# Patient Record
Sex: Male | Born: 1990 | Race: White | Hispanic: No | Marital: Single | Smoking: Current every day smoker
Health system: Southern US, Community
[De-identification: ages and names within clinical notes are randomized; demographics above are authoritative.]

## PROBLEM LIST (undated history)

## (undated) DIAGNOSIS — F329 Major depressive disorder, single episode, unspecified: Secondary | ICD-10-CM

## (undated) DIAGNOSIS — F410 Panic disorder [episodic paroxysmal anxiety] without agoraphobia: Secondary | ICD-10-CM

---
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
## 2009-03-05 ENCOUNTER — Emergency Department (HOSPITAL_COMMUNITY): Admission: EM | Admit: 2009-03-05 | Discharge: 2009-03-05 | Payer: Self-pay | Admitting: Emergency Medicine
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
## 2012-02-14 ENCOUNTER — Emergency Department: Payer: Self-pay | Admitting: Emergency Medicine

## 2012-02-14 LAB — URINALYSIS, COMPLETE
Bacteria: NONE SEEN
Bilirubin,UR: NEGATIVE
Glucose,UR: NEGATIVE mg/dL (ref 0–75)
Ketone: NEGATIVE
Leukocyte Esterase: NEGATIVE
Ph: 6 (ref 4.5–8.0)
Protein: NEGATIVE
RBC,UR: 1 /HPF (ref 0–5)
Specific Gravity: 1.002 (ref 1.003–1.030)
Squamous Epithelial: NONE SEEN
WBC UR: 1 /HPF (ref 0–5)

## 2012-08-02 ENCOUNTER — Emergency Department: Payer: Self-pay | Admitting: Emergency Medicine

## 2012-08-02 LAB — COMPREHENSIVE METABOLIC PANEL
Albumin: 4.3 g/dL (ref 3.4–5.0)
BUN: 12 mg/dL (ref 7–18)
Bilirubin,Total: 0.8 mg/dL (ref 0.2–1.0)
Calcium, Total: 9.1 mg/dL (ref 8.5–10.1)
Creatinine: 1.33 mg/dL — ABNORMAL HIGH (ref 0.60–1.30)
EGFR (Non-African Amer.): >60
Potassium: 4.2 mmol/L (ref 3.5–5.1)
SGPT (ALT): 17 U/L (ref 12–78)

## 2012-08-02 LAB — TSH: Thyroid Stimulating Horm: 0.65 u[IU]/mL

## 2012-08-02 LAB — URINALYSIS, COMPLETE
Leukocyte Esterase: NEGATIVE
Nitrite: NEGATIVE
Ph: 5 (ref 4.5–8.0)
Protein: NEGATIVE
Specific Gravity: 1.028 (ref 1.003–1.030)
WBC UR: 2 /HPF (ref 0–5)

## 2012-08-02 LAB — CBC
MCH: 32.6 pg (ref 26.0–34.0)
Platelet: 179 10*3/uL (ref 150–440)
RDW: 13.3 % (ref 11.5–14.5)
WBC: 6.5 10*3/uL (ref 3.8–10.6)

## 2012-08-02 LAB — DRUG SCREEN, URINE
Amphetamines, Ur Screen: NEGATIVE (ref ?–1000)
Barbiturates, Ur Screen: NEGATIVE (ref ?–200)
Benzodiazepine, Ur Scrn: POSITIVE (ref ?–200)
Cannabinoid 50 Ng, Ur ~~LOC~~: POSITIVE (ref ?–50)
Cocaine Metabolite,Ur ~~LOC~~: POSITIVE (ref ?–300)
Phencyclidine (PCP) Ur S: NEGATIVE (ref ?–25)
Tricyclic, Ur Screen: NEGATIVE (ref ?–1000)

## 2012-08-02 LAB — ETHANOL: Ethanol %: <0.003 % (ref 0.000–0.080)

## 2013-01-24 ENCOUNTER — Emergency Department: Payer: Self-pay | Admitting: Emergency Medicine

## 2014-08-27 ENCOUNTER — Encounter (HOSPITAL_COMMUNITY): Payer: Self-pay | Admitting: *Deleted

## 2014-08-27 ENCOUNTER — Emergency Department (HOSPITAL_COMMUNITY)
Admission: EM | Admit: 2014-08-27 | Discharge: 2014-08-28 | Disposition: A | Discharge status: Home / Self Care | Payer: Self-pay | Attending: Emergency Medicine | Admitting: Emergency Medicine

## 2014-08-27 DIAGNOSIS — R079 Chest pain, unspecified: Secondary | ICD-10-CM | POA: Insufficient documentation

## 2014-08-27 DIAGNOSIS — R002 Palpitations: Secondary | ICD-10-CM | POA: Insufficient documentation

## 2014-08-27 DIAGNOSIS — F419 Anxiety disorder, unspecified: Secondary | ICD-10-CM | POA: Insufficient documentation

## 2014-08-27 DIAGNOSIS — Z720 Tobacco use: Secondary | ICD-10-CM | POA: Insufficient documentation

## 2014-08-27 DIAGNOSIS — R0602 Shortness of breath: Secondary | ICD-10-CM | POA: Insufficient documentation

## 2014-08-27 HISTORY — DX: Panic disorder (episodic paroxysmal anxiety): F41.0

## 2014-08-27 LAB — I-STAT TROPONIN, ED: Troponin i, poc: 0 ng/mL (ref 0.00–0.08)

## 2014-08-27 NOTE — ED Provider Notes (Signed)
CSN: [id number]     Arrival date & time 08/27/14  2145 History   This chart was scribed for Devoria AlbeIva Jamilynn Whitacre, MD by Evon Slackerrance Branch, ED Scribe. This patient was seen in room APA10/APA10 and the patient's care was started at 11:03 PM.      Chief Complaint  Patient presents with  . Panic Attack   The history is provided by the patient. No language interpreter was used.   HPI Comments: David Lowe is a 24 y.o. male who presents to the Emergency Department complaining of severe anxiety that began this morning when waking up. Pt doesn't report any recent stressors that has recently made his anxiety worse, pt states that he suffers from anxiety constantly and daily. Pt states that he was treated by therapist at the age of 24. Pt states that he was taking medications for the anxiety for about 2-3 years after seeing the therapist. He states he was on Klonopin, Seroquel, and Depakote. Pt states that his symptoms include "heart pain", feeling his heart is beating hard and fast, chest tightness, SOB, "tightness and anxiety around my jaw and face" and tingling in his arms and hands. Pt states that the his anxiety attacks usually last all day until he goes to sleep. Patient basically states he feels this way every day. He also describes insomnia. Pt is an everyday smoker. Pt denies alcohol use. Mother states she has has a Hx of anxiety as does his paternal grandmother. There is a family history of heart disease. Pt denies illicit drug use.   PCP none   Past Medical History  Diagnosis Date  . Anxiety attack    History reviewed. No pertinent past surgical history. History reviewed. No pertinent family history. History  Substance Use Topics  . Smoking status: Current Every Day Smoker -- 0.50 packs/day  . Smokeless tobacco: Not on file  . Alcohol Use: No  unemployed  Review of Systems  Respiratory: Positive for chest tightness and shortness of breath.   Cardiovascular: Positive for chest pain and palpitations.   Psychiatric/Behavioral: The patient is nervous/anxious.   All other systems reviewed and are negative.    Allergies  Zyrtec  Home Medications   Prior to Admission medications   Not on File   BP 141/92 mmHg  Pulse 69  Temp(Src) 98.1 F (36.7 C) (Oral)  Resp 18  Ht 5\' 9"  (1.753 m)  Wt 150 lb (68.04 kg)  BMI 22.14 kg/m2  SpO2 99%  Vital signs normal     Physical Exam  Constitutional: He is oriented to person, place, and time. He appears well-developed and well-nourished.  Non-toxic appearance. He does not appear ill. No distress.  HENT:  Head: Normocephalic and atraumatic.  Right Ear: External ear normal.  Left Ear: External ear normal.  Nose: Nose normal. No mucosal edema or rhinorrhea.  Mouth/Throat: Oropharynx is clear and moist and mucous membranes are normal. No dental abscesses or uvula swelling.  Eyes: Conjunctivae and EOM are normal. Pupils are equal, round, and reactive to light.  Neck: Normal range of motion and full passive range of motion without pain. Neck supple.  Cardiovascular: Normal rate, regular rhythm and normal heart sounds.  Exam reveals no gallop and no friction rub.   No murmur heard. Pulmonary/Chest: Effort normal and breath sounds normal. No respiratory distress. He has no wheezes. He has no rhonchi. He has no rales. He exhibits no tenderness and no crepitus.  Abdominal: Soft. Normal appearance and bowel sounds are normal. He exhibits no  distension. There is no tenderness. There is no rebound and no guarding.  Musculoskeletal: Normal range of motion. He exhibits no edema or tenderness.  Moves all extremities well.   Neurological: He is alert and oriented to person, place, and time. He has normal strength. No cranial nerve deficit.  Skin: Skin is warm, dry and intact. No rash noted. No erythema. No pallor.  Psychiatric: He has a normal mood and affect. His speech is normal and behavior is normal. His mood appears not anxious.  Nursing note and  vitals reviewed.   ED Course  Procedures (including critical care time) DIAGNOSTIC STUDIES: Oxygen Saturation is 99% on RA, normal by my interpretation.    COORDINATION OF CARE: 11:30 PM-Discussed treatment plan with pt and his mother at bedside and pt agreed to plan.   EKG was done and one troponin. I discussed with patient and his mother [facility] [place] get assessed for his anxiety and let the psychiatrist decide what medication he needs for his chronic stable anxiety. Patient was not started on any benzodiazepines or other anti-anxiety medications while in the ED. This is a daily occurrence for this patient and there was nothing new today.      Labs Review Results for orders placed or performed during the hospital encounter of 08/27/14  I-stat troponin, ED  Result Value Ref Range   Troponin i, poc 0.00 0.00 - 0.08 ng/mL   Comment 3               Imaging Review No results found.   EKG Interpretation   Date/Time:  Sunday Aug 27 2014 23:39:41 EDT Ventricular Rate:  70 PR Interval:  131 QRS Duration: 101 QT Interval:  394 QTC Calculation: 425 R Axis:   88 Text Interpretation:  Sinus rhythm early repolarization Baseline wander in  lead(s) I III aVL V2 V3 No old tracing to compare Confirmed by Kruze Atchley   MD-I, Jalan Fariss ([id number]) on 08/28/2014 12:00:47 AM      MDM   Final diagnoses:  Anxiety    Plan discharge  Devoria AlbeIva Tyesha Joffe, MD, FACEP   I personally performed the services described in this documentation, which was scribed in my presence. The recorded information has been reviewed and considered.  Devoria AlbeIva Nikkie Liming, MD, Concha PyoFACEP      Miesha Bachmann, MD 08/28/14 [phone]

## 2014-08-27 NOTE — ED Notes (Signed)
Pt c/o having a panic attack; pt states he is having chest pain and tingling to hands

## 2014-08-28 NOTE — Discharge Instructions (Signed)
[facility] to get an [facility] for an [facility].    Panic Attacks Panic attacks are sudden, short feelings of great fear or discomfort. You may have them for no reason when you are relaxed, when you are uneasy (anxious), or when you are sleeping.  HOME CARE  Take all your medicines as told.  Check with your doctor before starting new medicines.  Keep all doctor visits. GET HELP IF:  You are not able to take your medicines as told.  Your symptoms do not get better.  Your symptoms get worse. GET HELP RIGHT AWAY IF:  Your attacks seem different than your normal attacks.  You have thoughts about hurting yourself or others.  You take panic attack medicine and you have a side effect. MAKE SURE YOU:  Understand these instructions.  Will watch your condition.  Will get help right away if you are not doing well or get worse. Document Released: 05/10/2010 Document Revised: 01/26/2013 Document Reviewed: 11/19/2012 [facility] Patient Information 2015 [place]. This information is not intended to replace advice given to you by your health care provider. Make sure you discuss any questions you have with your health care provider.

## 2014-08-28 NOTE — ED Notes (Signed)
Discharge instructions given and reviewed with patient.  Information on [facility] given.  Patient verbalized understanding [facility]  Patient ambulatory; discharged home in good condition.

## 2014-08-28 NOTE — ED Notes (Signed)
Pt requesting something for his nerves; Dr. Lynelle DoctorKnapp made aware, no new orders given

## 2015-08-19 ENCOUNTER — Emergency Department
Admission: EM | Admit: 2015-08-19 | Discharge: 2015-08-19 | Disposition: A | Discharge status: Home / Self Care | Payer: Self-pay | Attending: Emergency Medicine | Admitting: Emergency Medicine

## 2015-08-19 DIAGNOSIS — F4322 Adjustment disorder with anxiety: Secondary | ICD-10-CM | POA: Insufficient documentation

## 2015-08-19 DIAGNOSIS — F17200 Nicotine dependence, unspecified, uncomplicated: Secondary | ICD-10-CM | POA: Insufficient documentation

## 2015-08-19 LAB — COMPREHENSIVE METABOLIC PANEL
ALK PHOS: 69 U/L (ref 38–126)
ALT: 16 U/L — ABNORMAL LOW (ref 17–63)
AST: 33 U/L (ref 15–41)
Albumin: 4.8 g/dL (ref 3.5–5.0)
Anion gap: 10 (ref 5–15)
BILIRUBIN TOTAL: 0.9 mg/dL (ref 0.3–1.2)
BUN: 20 mg/dL (ref 6–20)
CALCIUM: 9.4 mg/dL (ref 8.9–10.3)
CHLORIDE: 105 mmol/L (ref 101–111)
CO2: 26 mmol/L (ref 22–32)
Creatinine, Ser: 1.12 mg/dL (ref 0.61–1.24)
GFR calc Af Amer: >60 mL/min (ref >60)
GFR calc non Af Amer: >60 mL/min (ref >60)
Glucose, Bld: 97 mg/dL (ref 65–99)
Potassium: 4.2 mmol/L (ref 3.5–5.1)
SODIUM: 141 mmol/L (ref 135–145)
Total Protein: 7.5 g/dL (ref 6.5–8.1)

## 2015-08-19 LAB — CBC
HCT: 46.9 % (ref 40.0–52.0)
Hemoglobin: 15.9 g/dL (ref 13.0–18.0)
MCH: 31.6 pg (ref 26.0–34.0)
MCHC: 33.8 g/dL (ref 32.0–36.0)
MCV: 93.5 fL (ref 80.0–100.0)
PLATELETS: 218 10*3/uL (ref 150–440)
RBC: 5.02 MIL/uL (ref 4.40–5.90)
RDW: 13 % (ref 11.5–14.5)
WBC: 7.4 10*3/uL (ref 3.8–10.6)

## 2015-08-19 LAB — ETHANOL

## 2015-08-19 LAB — ACETAMINOPHEN LEVEL: Acetaminophen (Tylenol), Serum: <10 ug/mL — ABNORMAL LOW (ref 10–30)

## 2015-08-19 LAB — SALICYLATE LEVEL

## 2015-08-19 MED ORDER — CITALOPRAM HYDROBROMIDE 20 MG PO TABS: 20.0000 mg | ORAL_TABLET | Freq: Every day | ORAL | Status: DC

## 2015-08-19 MED ORDER — CITALOPRAM HYDROBROMIDE 20 MG PO TABS
20.0000 mg | ORAL_TABLET | ORAL | Status: AC
  Administered 2015-08-19: 20 mg via ORAL
  Filled 2015-08-19: qty 1

## 2015-08-19 NOTE — ED Notes (Signed)
Pt denies comments/concerns at this time. Pt given information to follow up with [facility] at this time as well asprescription for Celexa. Pt instructed not to drink or engage in any drugs while on this medication. Pt states understanding of D/C instructions and states no questions.NAD noted, pt ambulatory to the lobby.

## 2015-08-19 NOTE — ED Notes (Signed)
Pt resting in bed with eyes closed. NAD noted at this time. Respirations even and unlabored at this time. Will continue to monitor with 15 min safety checks.

## 2015-08-19 NOTE — ED Provider Notes (Signed)
[facility] Emergency Department Provider Note  ____________________________________________  Time seen: Approximately 3:31 PM  I have reviewed the triage vital signs and the nursing notes.   HISTORY  Chief Complaint Anxiety    HPI David DibblesJoey Lowe is a 25 y.o. male reports that he's had problems with severe anxiety since about age 25. He is self treated with Xanax which she buys from time to time. He reports that his father and brother died in a car accident on the 15th of this month, since then he's been having increasing anxiety. Reports that due to feeling so anxious he hasn't slept well for 3 days. He denies any hallucinations, thoughts or ideas of harming himself, any desire to commit suicide, and does not want to harm anyone else.     Past Medical History  Diagnosis Date  . Anxiety attack     There are no active problems to display for this patient.   No past surgical history on file.  Current Outpatient Rx  Name  Route  Sig  Dispense  Refill  . citalopram (CELEXA) 20 MG tablet   Oral   Take 1 tablet (20 mg total) by mouth daily.   14 tablet   0     Allergies Seroquel and Zyrtec  No family history on file.  Social History Social History  Substance Use Topics  . Smoking status: Current Every Day Smoker -- 0.50 packs/day  . Smokeless tobacco: Not on file  . Alcohol Use: No    Review of Systems Constitutional: No fever/chills Eyes: No visual changes. ENT: No sore throat. Cardiovascular: Denies chest pain. Respiratory: Denies shortness of breath. Gastrointestinal: No abdominal pain.  No nausea, no vomiting.  No diarrhea.  No constipation. Genitourinary: Negative for dysuria. Musculoskeletal: Negative for back pain. Skin: Negative for rash. Neurological: Negative for headaches, focal weakness or numbness.  10-point ROS otherwise negative.  ____________________________________________   PHYSICAL EXAM:  VITAL SIGNS: ED Triage  Vitals  Enc Vitals Group     BP 08/19/15 1511 212/80 mmHg     Pulse Rate 08/19/15 1511 79     Resp 08/19/15 1511 18     Temp 08/19/15 1511 98 F (36.7 C)     Temp Source 08/19/15 1511 Oral     SpO2 08/19/15 1511 100 %     Weight 08/19/15 1511 130 lb (58.968 kg)     Height 08/19/15 1511 5\' 9"  (1.753 m)     Head Cir --      Peak Flow --      Pain Score --      Pain Loc --      Pain Edu? --      Excl. in GC? --    Constitutional: Alert and oriented. Well appearing and in no acute distress.Patient is slightly anxious. Eyes: Conjunctivae are normal. PERRL. EOMI. Head: Atraumatic. Nose: No congestion/rhinnorhea. Mouth/Throat: Mucous membranes are moist.  Oropharynx non-erythematous. Neck: No stridor.   Cardiovascular: Normal rate, regular rhythm. Grossly normal heart sounds.  Good peripheral circulation. Respiratory: Normal respiratory effort.  No retractions. Lungs CTAB. Gastrointestinal: Soft and nontender. No distention.  Musculoskeletal: No lower extremity tenderness nor edema.  No joint effusions. Neurologic:  Normal speech and language. No gross focal neurologic deficits are appreciated. No gait instability. Skin:  Skin is warm, dry and intact. No rash noted. Psychiatric: Mood and affect are slightly anxious. Speech and behavior are normal. Patient denies any thoughts of self-harm, hallucinations, or desire to harm himself. Does report  poor sleep for 3 days and feeling very anxious daily since his family members were killed in a car accident on the 15th of the month.  The patient's thought process he appears normal. No tangential or circumferential thinking. No evidence of delirium. He is well oriented 4.  ____________________________________________   LABS (all labs ordered are listed, but only abnormal results are displayed)  Labs Reviewed  COMPREHENSIVE METABOLIC PANEL - Abnormal; Notable for the following:    ALT 16 (*)    All other components within normal limits   ACETAMINOPHEN LEVEL - Abnormal; Notable for the following:    Acetaminophen (Tylenol), Serum <10 (*)    All other components within normal limits  ETHANOL  SALICYLATE LEVEL  CBC  URINE DRUG SCREEN, QUALITATIVE ([facility] ONLY)   ____________________________________________  EKG   ____________________________________________  RADIOLOGY   ____________________________________________   PROCEDURES  Procedure(s) performed: None  Critical Care performed: No  ____________________________________________   INITIAL IMPRESSION / ASSESSMENT AND PLAN / ED COURSE  Pertinent labs & imaging results that were available during my care of the patient were reviewed by me and considered in my medical decision making (see chart for details).  Patient reports increased anxiety from his baseline, and that he is having worsening anxiety after the death of his father and brother. This presentation seems to be that of likely an adjustment type disorder. He does not demonstrate any evidence of acute psychosis, he is well oriented, does not appear to be under the influence, and he absolutely on multiple questionings denies any hallucinations, suicidal thoughts, or feeling at risk for harming himself or anyone else.  Medically he does not have any acute complaints. No signs of withdrawal, and he reports that he buys in uses Xanax from time to time, but has no history of withdrawals. Denies any history of previous suicide attempts. I discussed this case with Dr. Toni Amend of psychiatry, he recommends adding Celexa 20 mg daily and following up with [facility] tomorrow. The patient is agreeable to this plan, and he is also agreeable to returning to the ER right away if he is to develop any intrusive thoughts, desire to harm himself, thoughts about hurting anyone else, hallucinations or any new concerns arise.  The patient does not demonstrate any signs or symptoms suggest a need for involuntary commitment. I will  discharge the patient to home with prescription for Celexa for 2 weeks, and a plan that he will follow-up with [facility] tomorrow morning. Patient provided [facility] information sheet.  ----------------------------------------- 5:30 PM on 08/19/2015 -----------------------------------------  Patient pleasant, currently eating a sandwich. Repeat blood pressure normal. He is agreeable with the plan to discharge with a prescription for Celexa, and states that he will be able to follow up with [facility] tomorrow morning as planned. ____________________________________________   FINAL CLINICAL IMPRESSION(S) / ED DIAGNOSES  Final diagnoses:  Adjustment disorder with anxious mood      Sharyn Creamer, MD 08/19/15 1731

## 2015-08-19 NOTE — Discharge Instructions (Signed)
You have been seen in the Emergency Department (ED) today for a psychiatric complaint.    Please return to the ED immediately if you have ANY thoughts of hurting yourself or anyone else, so that we may help you.  Please avoid alcohol and drug use.  Follow up with [facility] tomorrow (see information sheet you were given) as well as your doctor and/or therapist as soon as possible regarding today's ED visit.   Please follow up any other recommendations and clinic appointments provided by the psychiatry team that saw you in the Emergency Department.   Adjustment Disorder Adjustment disorder is an unusually severe reaction to a stressful life event, such as the loss of a job or physical illness. The event may be any stressful event other than the loss of a loved one. Adjustment disorder may affect your feelings, your thinking, how you act, or a combination of these. It may interfere with personal relationships or with the way you are at work, school, or home. People with this disorder are at risk for suicide and substance abuse. They may develop a more serious mental disorder, such as major depressive disorder or post-traumatic stress disorder. SIGNS AND SYMPTOMS  Symptoms may include:  Sadness, depressed mood, or crying spells.  Loss of enjoyment.  Change in appetite or weight.  Sense of loss or hopelessness.  Thoughts of suicide.  Anxiety, worry, or nervousness.  Trouble sleeping.  Avoiding family and friends.  Poor school performance.  Fighting or vandalism.  Reckless driving.  Skipping school.  Poor work International aid/development workerperformance.  Ignoring bills. Symptoms of adjustment disorder start within 3 months of the stressful life event. They do not last more than 6 months after the event has ended. DIAGNOSIS  To make a diagnosis, your health care provider will ask about what has happened in your life and how it has affected you. He or she may also ask about your medical history and use of medicines,  alcohol, and other substances. Your health care provider may do a physical exam and order lab tests or other studies. You may be referred to a mental health specialist for evaluation. TREATMENT  Treatment options include:  Counseling or talk therapy. Talk therapy is usually provided by mental health specialists.  Medicine. Certain medicines may help with depression, anxiety, and sleep.  Support groups. Support groups offer emotional support, advice, and guidance. They are made up of people who have had similar experiences. HOME CARE INSTRUCTIONS  Keep all follow-up visits as directed by your health care provider. This is important.  Take medicines only as directed by your health care provider. SEEK MEDICAL CARE IF:  Your symptoms get worse.  SEEK IMMEDIATE MEDICAL CARE IF: You have serious thoughts about hurting yourself or someone else. MAKE SURE YOU:  Understand these instructions.  Will watch your condition.  Will get help right away if you are not doing well or get worse.   This information is not intended to replace advice given to you by your health care provider. Make sure you discuss any questions you have with your health care provider.   Document Released: 12/10/2005 Document Revised: 04/28/2014 Document Reviewed: 08/30/2013 Elsevier Interactive Patient Education [employer].

## 2015-08-19 NOTE — ED Notes (Signed)
MD to bedside to discuss pt discharge and follow up. Pt given dinner tray, to be discharged after he finished eating.

## 2015-08-19 NOTE — ED Notes (Addendum)
Pt reports I have been a nervous wreck states I have recently lost my dad and 244 year old brother on the 15th  and taking Xanax 5mg  total each day at different times of the day with no help with my anxiety., Denies SI/HI. Denies recent drug or alcohol use. States he has not slept in 3 days. Wanting Detox

## 2015-09-21 ENCOUNTER — Emergency Department
Admission: EM | Admit: 2015-09-21 | Discharge: 2015-09-21 | Disposition: A | Discharge status: Home / Self Care | Payer: Self-pay | Attending: Emergency Medicine | Admitting: Emergency Medicine

## 2015-09-21 DIAGNOSIS — F1910 Other psychoactive substance abuse, uncomplicated: Secondary | ICD-10-CM | POA: Insufficient documentation

## 2015-09-21 DIAGNOSIS — F1290 Cannabis use, unspecified, uncomplicated: Secondary | ICD-10-CM | POA: Insufficient documentation

## 2015-09-21 DIAGNOSIS — R252 Cramp and spasm: Secondary | ICD-10-CM | POA: Insufficient documentation

## 2015-09-21 DIAGNOSIS — F17210 Nicotine dependence, cigarettes, uncomplicated: Secondary | ICD-10-CM | POA: Insufficient documentation

## 2015-09-21 DIAGNOSIS — F329 Major depressive disorder, single episode, unspecified: Secondary | ICD-10-CM | POA: Insufficient documentation

## 2015-09-21 HISTORY — DX: Major depressive disorder, single episode, unspecified: F32.9

## 2015-09-21 LAB — URINE DRUG SCREEN, QUALITATIVE (ARMC ONLY)
AMPHETAMINES, UR SCREEN: NOT DETECTED
BENZODIAZEPINE, UR SCRN: POSITIVE — AB
Barbiturates, Ur Screen: NOT DETECTED
Cannabinoid 50 Ng, Ur ~~LOC~~: POSITIVE — AB
Cocaine Metabolite,Ur ~~LOC~~: POSITIVE — AB
MDMA (ECSTASY) UR SCREEN: NOT DETECTED
Methadone Scn, Ur: NOT DETECTED
Opiate, Ur Screen: NOT DETECTED
PHENCYCLIDINE (PCP) UR S: NOT DETECTED
TRICYCLIC, UR SCREEN: NOT DETECTED

## 2015-09-21 LAB — URINALYSIS COMPLETE WITH MICROSCOPIC (ARMC ONLY)
BACTERIA UA: NONE SEEN
Bilirubin Urine: NEGATIVE
GLUCOSE, UA: NEGATIVE mg/dL
Hgb urine dipstick: NEGATIVE
Leukocytes, UA: NEGATIVE
NITRITE: NEGATIVE
PROTEIN: 30 mg/dL — AB
SPECIFIC GRAVITY, URINE: 1.029 (ref 1.005–1.030)
pH: 6 (ref 5.0–8.0)

## 2015-09-21 LAB — CK: CK TOTAL: 582 U/L — AB (ref 49–397)

## 2015-09-21 LAB — BASIC METABOLIC PANEL
Anion gap: 8 (ref 5–15)
BUN: 15 mg/dL (ref 6–20)
CO2: 25 mmol/L (ref 22–32)
Calcium: 9.3 mg/dL (ref 8.9–10.3)
Chloride: 106 mmol/L (ref 101–111)
Creatinine, Ser: 0.93 mg/dL (ref 0.61–1.24)
GFR calc Af Amer: >60 mL/min (ref >60)
GFR calc non Af Amer: >60 mL/min (ref >60)
Glucose, Bld: 107 mg/dL — ABNORMAL HIGH (ref 65–99)
POTASSIUM: 3.3 mmol/L — AB (ref 3.5–5.1)
SODIUM: 139 mmol/L (ref 135–145)

## 2015-09-21 LAB — CBC
HEMATOCRIT: 45.7 % (ref 40.0–52.0)
HEMOGLOBIN: 15.8 g/dL (ref 13.0–18.0)
MCH: 31.9 pg (ref 26.0–34.0)
MCHC: 34.5 g/dL (ref 32.0–36.0)
MCV: 92.6 fL (ref 80.0–100.0)
Platelets: 239 10*3/uL (ref 150–440)
RBC: 4.94 MIL/uL (ref 4.40–5.90)
RDW: 13.6 % (ref 11.5–14.5)
WBC: 9.5 10*3/uL (ref 3.8–10.6)

## 2015-09-21 MED ORDER — DIAZEPAM 2 MG PO TABS: 2.0000 mg | ORAL_TABLET | Freq: Four times a day (QID) | ORAL | Status: DC | PRN

## 2015-09-21 MED ORDER — DIAZEPAM 5 MG/ML IJ SOLN
5.0000 mg | Freq: Once | INTRAMUSCULAR | Status: AC
  Administered 2015-09-21: 5 mg via INTRAVENOUS
  Filled 2015-09-21: qty 2

## 2015-09-21 MED ORDER — SODIUM CHLORIDE 0.9 % IV BOLUS (SEPSIS)
1000.0000 mL | Freq: Once | INTRAVENOUS | Status: AC
  Administered 2015-09-21: 1000 mL via INTRAVENOUS

## 2015-09-21 NOTE — ED Notes (Signed)
Pt resting in bed, mother at bedside, pt awake and alert in no acute distress 

## 2015-09-21 NOTE — Discharge Instructions (Signed)

## 2015-09-21 NOTE — ED Notes (Signed)
Pt ambulated to hallway, MD at bedside
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
## 2015-09-21 NOTE — ED Notes (Signed)
Pt reports he was smoking outside on his porch and noticed some discoloration in legs and feet. Reports loss of mobility in legs.

## 2015-11-15 ENCOUNTER — Emergency Department
Admission: EM | Admit: 2015-11-15 | Discharge: 2015-11-15 | Disposition: A | Discharge status: Home / Self Care | Payer: Self-pay | Attending: Emergency Medicine | Admitting: Emergency Medicine

## 2015-11-15 ENCOUNTER — Encounter: Payer: Self-pay | Admitting: Emergency Medicine

## 2015-11-15 DIAGNOSIS — F1410 Cocaine abuse, uncomplicated: Secondary | ICD-10-CM | POA: Insufficient documentation

## 2015-11-15 DIAGNOSIS — F17210 Nicotine dependence, cigarettes, uncomplicated: Secondary | ICD-10-CM | POA: Insufficient documentation

## 2015-11-15 DIAGNOSIS — F329 Major depressive disorder, single episode, unspecified: Secondary | ICD-10-CM | POA: Insufficient documentation

## 2015-11-15 DIAGNOSIS — Z79899 Other long term (current) drug therapy: Secondary | ICD-10-CM | POA: Insufficient documentation

## 2015-11-15 DIAGNOSIS — F1290 Cannabis use, unspecified, uncomplicated: Secondary | ICD-10-CM | POA: Insufficient documentation

## 2015-11-15 NOTE — ED Notes (Signed)
Pt presents to ER requesting help for drug abuse.  Pt reports cocaine use of 7-14 grams daily (last use this am) and five to six 2mg  xanax daily )last use last night. Pt denies SI/HI/AH/VH. Pt is alert and orient x4 breathing even and unlabored. Pt states been to drug rehab facility in [place]. Pt requesting to go somewhere to get detox.

## 2015-11-15 NOTE — ED Triage Notes (Signed)
Pt here with grandfather via POV request help for drug abuse.  Pt reports cocaine use of 7-14 grams daily (last use this am) and five to six 2mg  xanax daily )last use last night). Pt denies SI/HI/AH/VH. NAD and A&O x4.

## 2015-11-15 NOTE — Progress Notes (Signed)
TTS spoke with Mr. Nicholes, He received information from TTS for outpatient substance abuse programs and information for inpatient substance abuse programs

## 2015-11-15 NOTE — ED Notes (Addendum)
Discharge instructions reviewed with patient. Questions fielded by this RN. Patient verbalizes understanding of instructions. Patient discharged home in stable condition per [facility] MD . No acute distress noted at time of discharge. Pt denies SI/HI at time of discharge. Pt given multiple contact numbers for detox and spoke to a counselor while here.

## 2015-11-15 NOTE — ED Provider Notes (Signed)
[facility] Emergency Department Provider Note  ____________________________________________   I have reviewed the triage vital signs and the nursing notes.   HISTORY  Chief Complaint Medical Clearance (pt requests help for drug addiction)    HPI David Lowe is a 25 y.o. male presents today complaining of cocaine abuse. He does not use IV drugs. He only inhales. He does not have any other complaints. He denies any chest pain shows breath nausea or vomiting. Denies any thoughts of hurting himself or anyone [place]. He would like resources for detox.   He does not use IVDA he states.   Past Medical History:  Diagnosis Date  . Anxiety attack   . MDD (major depressive disorder) (HCC)     There are no active problems to display for this patient.   History reviewed. No pertinent surgical history.  Current Outpatient Rx  . Order #: [id number] Class: Print  . Order #: [id number] Class: Print    Allergies Seroquel [quetiapine] and Zyrtec [cetirizine]  History reviewed. No pertinent family history.  Social History Social History  Substance Use Topics  . Smoking status: Current Every Day Smoker    Packs/day: 0.50    Types: Cigarettes  . Smokeless tobacco: Never Used  . Alcohol use No    Review of Systems Constitutional: No fever/chills Eyes: No visual changes. ENT: No sore throat. No stiff neck no neck pain Cardiovascular: Denies chest pain. Respiratory: Denies shortness of breath. Gastrointestinal:   no vomiting.  No diarrhea.  No constipation. Genitourinary: Negative for dysuria. Musculoskeletal: Negative lower extremity swelling Skin: Negative for rash. Neurological: Negative for severe headaches, focal weakness or numbness. 10-point ROS otherwise negative.  ____________________________________________   PHYSICAL EXAM:  VITAL SIGNS: ED Triage Vitals  Enc Vitals Group     BP 11/15/15 1923 133/81     Pulse Rate 11/15/15 1923 76     Resp  11/15/15 1923 18     Temp 11/15/15 1923 98 F (36.7 C)     Temp Source 11/15/15 1923 Oral     SpO2 11/15/15 1923 99 %     Weight 11/15/15 1923 152 lb (68.9 kg)     Height 11/15/15 1923  (1.727 m)     Head Circumference --      Peak Flow --      Pain Score 11/15/15 2038 0     Pain Loc --      Pain Edu? --      Excl. in GC? --     Constitutional: Alert and oriented. Well appearing and in no acute distress. Mouth/Throat: Mucous membranes are moist.  Oropharynx non-erythematous. Neck: No stridor.   Nontender with no meningismus Cardiovascular: Normal rate, regular rhythm. Grossly normal heart sounds.  Good peripheral circulation. Respiratory: Normal respiratory effort.  No retractions. Lungs CTAB. Abdominal: Soft and nontender. No distention. No guarding no rebound Musculoskeletal: No lower extremity tenderness, no upper extremity tenderness. No joint effusions, no DVT signs strong distal pulses no edema Neurologic:  Normal speech and language. No gross focal neurologic deficits are appreciated.  Skin:  Skin is warm, dry and intact. No rash noted. Psychiatric: Mood and affect are normal. Speech and behavior are normal.  ____________________________________________   LABS (all labs ordered are listed, but only abnormal results are displayed)  Labs Reviewed - No data to display ____________________________________________  EKG  I personally interpreted any EKGs ordered by me or triage  ____________________________________________  RADIOLOGY  I reviewed any imaging ordered by me or triage that  were performed during my shift and, if possible, patient and/or family made aware of any abnormal findings. ____________________________________________   PROCEDURES  Procedure(s) performed: None  Procedures  Critical Care performed: None  ____________________________________________   INITIAL IMPRESSION / ASSESSMENT AND PLAN / ED COURSE  Pertinent labs & imaging results  that were available during my care of the patient were reviewed by me and considered in my medical decision making (see chart for details).  Patient here with a history of cocaine abuse seeking detox. We are not a detox facility. There is no other acute medical issue. We will see if we can help him get to her he needs to be. Counseled him about drug abuse. He has no chest pain or thoughts of hurting himself.  Clinical Course   ____________________________________________   FINAL CLINICAL IMPRESSION(S) / ED DIAGNOSES  Final diagnoses:  None      This chart was dictated using voice recognition software.  Despite best efforts to proofread,  errors can occur which can change meaning.      Jeanmarie Plant, MD 11/15/15 2103

## 2015-11-26 ENCOUNTER — Emergency Department (HOSPITAL_COMMUNITY): Payer: Self-pay

## 2015-11-26 ENCOUNTER — Encounter (HOSPITAL_COMMUNITY): Payer: Self-pay | Admitting: Emergency Medicine

## 2015-11-26 ENCOUNTER — Emergency Department (HOSPITAL_COMMUNITY)
Admission: EM | Admit: 2015-11-26 | Discharge: 2015-11-26 | Disposition: A | Discharge status: Home / Self Care | Payer: Self-pay | Attending: Emergency Medicine | Admitting: Emergency Medicine

## 2015-11-26 DIAGNOSIS — L02416 Cutaneous abscess of left lower limb: Secondary | ICD-10-CM | POA: Insufficient documentation

## 2015-11-26 DIAGNOSIS — F17210 Nicotine dependence, cigarettes, uncomplicated: Secondary | ICD-10-CM | POA: Insufficient documentation

## 2015-11-26 LAB — CBC WITH DIFFERENTIAL/PLATELET
BASOS ABS: 0 10*3/uL (ref 0.0–0.1)
Basophils Relative: 0 %
Eosinophils Absolute: 0.1 10*3/uL (ref 0.0–0.7)
Eosinophils Relative: 1 %
HCT: 45.5 % (ref 39.0–52.0)
HEMOGLOBIN: 15.6 g/dL (ref 13.0–17.0)
LYMPHS ABS: 1.6 10*3/uL (ref 0.7–4.0)
LYMPHS PCT: 12 %
MCH: 32.6 pg (ref 26.0–34.0)
MCHC: 34.3 g/dL (ref 30.0–36.0)
MCV: 95 fL (ref 78.0–100.0)
Monocytes Absolute: 0.7 10*3/uL (ref 0.1–1.0)
Monocytes Relative: 5 %
NEUTROS ABS: 11 10*3/uL — AB (ref 1.7–7.7)
NEUTROS PCT: 82 %
PLATELETS: 205 10*3/uL (ref 150–400)
RBC: 4.79 MIL/uL (ref 4.22–5.81)
RDW: 12.8 % (ref 11.5–15.5)
WBC: 13.4 10*3/uL — AB (ref 4.0–10.5)

## 2015-11-26 LAB — BASIC METABOLIC PANEL
ANION GAP: 4 — AB (ref 5–15)
BUN: 13 mg/dL (ref 6–20)
CHLORIDE: 103 mmol/L (ref 101–111)
CO2: 30 mmol/L (ref 22–32)
Calcium: 8.4 mg/dL — ABNORMAL LOW (ref 8.9–10.3)
Creatinine, Ser: 1.02 mg/dL (ref 0.61–1.24)
Glucose, Bld: 99 mg/dL (ref 65–99)
POTASSIUM: 4.3 mmol/L (ref 3.5–5.1)
SODIUM: 137 mmol/L (ref 135–145)

## 2015-11-26 MED ORDER — HYDROCODONE-ACETAMINOPHEN 5-325 MG PO TABS: ORAL_TABLET | ORAL | 0 refills | Status: DC

## 2015-11-26 MED ORDER — KETOROLAC TROMETHAMINE 30 MG/ML IJ SOLN
30.0000 mg | Freq: Once | INTRAMUSCULAR | Status: AC
  Administered 2015-11-26: 30 mg via INTRAVENOUS
  Filled 2015-11-26: qty 1

## 2015-11-26 MED ORDER — LIDOCAINE-EPINEPHRINE (PF) 2 %-1:200000 IJ SOLN
10.0000 mL | Freq: Once | INTRAMUSCULAR | Status: DC
  Filled 2015-11-26: qty 20

## 2015-11-26 MED ORDER — SULFAMETHOXAZOLE-TRIMETHOPRIM 800-160 MG PO TABS: 1.0000 | ORAL_TABLET | Freq: Two times a day (BID) | ORAL | 0 refills | Status: AC

## 2015-11-26 MED ORDER — VANCOMYCIN HCL IN DEXTROSE 1-5 GM/200ML-% IV SOLN
1000.0000 mg | Freq: Once | INTRAVENOUS | Status: AC
  Administered 2015-11-26: 1000 mg via INTRAVENOUS
  Filled 2015-11-26: qty 200

## 2015-11-26 MED ORDER — MORPHINE SULFATE (PF) 4 MG/ML IV SOLN
4.0000 mg | Freq: Once | INTRAVENOUS | Status: AC
Start: 2015-11-26 — End: 2015-11-26
  Administered 2015-11-26: 4 mg via INTRAVENOUS
  Filled 2015-11-26: qty 1

## 2015-11-26 MED ORDER — IBUPROFEN 800 MG PO TABS: 800.0000 mg | ORAL_TABLET | Freq: Three times a day (TID) | ORAL | 0 refills | Status: DC

## 2015-11-26 NOTE — ED Provider Notes (Signed)
[facility]-EMERGENCY DEPT Provider Note   CSN: [id number] Arrival date & time: 11/26/15  1116  First Provider Contact:  First MD Initiated Contact with Patient 11/26/15 1143     By signing my name below, I, Majel Homer, attest that this documentation has been prepared under the direction and in the presence of non-physician practitioner, Amrom Ore, PA-C. Electronically Signed: Majel Homer, Scribe. 11/26/2015. 11:55 AM.  History   Chief Complaint Chief Complaint  Patient presents with  . Knee Pain   The history is provided by the patient. No language interpreter was used.    HPI Comments: David Lowe is a 25 y.o. male with PMHx of MDD and cocaine drug abuse, who presents to the Emergency Department complaining of pain, redness and swelling of the left knee.  He states the symptoms began 2 days ago as a "pimple" that he squeezed.  He notes pain with weight bearing and movement of the knee.  He has not tried any therapies or medications.  He denies fever, chills, vomiting, red streaks, or inability to flex the knee.  Past Medical History:  Diagnosis Date  . Anxiety attack   . MDD (major depressive disorder) (HCC)    There are no active problems to display for this patient.  History reviewed. No pertinent surgical history.  Home Medications    Prior to Admission medications   Medication Sig Start Date End Date Taking? Authorizing Provider  citalopram (CELEXA) 20 MG tablet Take 1 tablet (20 mg total) by mouth daily. 08/19/15 08/18/16  Sharyn Creamer, MD  diazepam (VALIUM) 2 MG tablet Take 1 tablet (2 mg total) by mouth every 6 (six) hours as needed for muscle spasms. 09/21/15 09/20/16  Myrna Blazer, MD    Family History History reviewed. No pertinent family history.  Social History Social History  Substance Use Topics  . Smoking status: Current Every Day Smoker    Packs/day: 0.50    Types: Cigarettes  . Smokeless tobacco: Never Used  . Alcohol use No     Allergies   Seroquel  [quetiapine] and Zyrtec [cetirizine]  Review of Systems Review of Systems  Constitutional: Negative for chills and fever.  Gastrointestinal: Negative for nausea and vomiting.  Musculoskeletal: Negative for arthralgias and joint swelling.  Skin: Positive for color change.       Redness, swelling and pain to left knee  Neurological: Negative for weakness and numbness.  Hematological: Negative for adenopathy.  All other systems reviewed and are negative.   Physical Exam Updated Vital Signs BP 127/75 (BP Location: Left Arm)   Pulse 90   Temp 98.9 F (37.2 C) (Oral)   Resp 14   Ht  (1.727 m)   Wt 155 lb (70.3 kg)   SpO2 99%   BMI 23.57 kg/m   Physical Exam  Constitutional: He is oriented to person, place, and time. He appears well-developed and well-nourished.  HENT:  Head: Normocephalic and atraumatic.  Eyes: Conjunctivae are normal.  Neck: Normal range of motion. No JVD present. No tracheal deviation present.  Pulmonary/Chest: Effort normal. No stridor. No respiratory distress.  Musculoskeletal:  4 cm area of erythema to the skin overlying the left patella.  3 cm area of induration of the skin.  Appears superficial.  No joint effusion.  Pt has nml ROM of the knee.   Neurological: He is alert and oriented to person, place, and time. Coordination normal.  Skin: Skin is warm. No rash noted.  Psychiatric: He has a normal mood and  affect.  Nursing note and vitals reviewed.  ED Treatments / Results  Labs (all labs ordered are listed, but only abnormal results are displayed) Labs Reviewed  BASIC METABOLIC PANEL - Abnormal; Notable for the following:       Result Value   Calcium 8.4 (*)    Anion gap 4 (*)    All other components within normal limits  CBC WITH DIFFERENTIAL/PLATELET - Abnormal; Notable for the following:    WBC 13.4 (*)    Neutro Abs 11.0 (*)    All other components within normal limits    EKG  EKG Interpretation None       Radiology Dg Knee  Complete 4 Views Left  Result Date: 11/26/2015 CLINICAL DATA:  Redness and swelling anterior aspect of the knee. Initial encounter. EXAM: LEFT KNEE - COMPLETE 4+ VIEW COMPARISON:  The tip FINDINGS: Soft tissue swelling is seen anterior to the patella. No radiopaque foreign body or soft tissue gas collection. Imaged bones appear normal. There is no joint effusion. IMPRESSION: Prepatellar soft tissue swelling without foreign body. No bony abnormality. Electronically Signed   By: Drusilla Kannerhomas  Dalessio M.D.   On: 11/26/2015 12:44     Procedures Procedures  INCISION AND DRAINAGE PROCEDURE NOTE: Patient identification was confirmed and verbal consent was obtained. This procedure was performed by Pauline Ausammy Eshan Trupiano, PA-C at 1:34 PM.  INCISION AND DRAINAGE Performed by: Maxwell CaulRIPLETT,Manjot Beumer L. Consent: Verbal consent obtained. Risks and benefits: risks, benefits and alternatives were discussed Type: abscess  Body area: skin overlying left knee  Anesthesia: local infiltration  Incision was made with a #11 scalpel.  Local anesthetic: lidocaine 2 % w/ epinephrine  Anesthetic total: 2 ml  Complexity: complex Blunt dissection to break up loculations  Drainage: purulent  Drainage amount: small  Packing material: none  Patient tolerance: Patient tolerated the procedure well with no immediate complications.    DIAGNOSTIC STUDIES:  Oxygen Saturation is 99% on RA, normal by my interpretation.    COORDINATION OF CARE:  11:53 AM Discussed treatment plan  Medications Ordered in ED Medications  morphine 4 MG/ML injection 4 mg (4 mg Intravenous Given 11/26/15 1215)  ketorolac (TORADOL) 30 MG/ML injection 30 mg (30 mg Intravenous Given 11/26/15 1215)  vancomycin (VANCOCIN) IVPB 1000 mg/200 mL premix (0 mg Intravenous Stopped 11/26/15 1522)    Initial Impression / Assessment and Plan / ED Course  I have reviewed the triage vital signs and the nursing notes.  Pertinent labs & imaging results that were  available during my care of the patient were reviewed by me and considered in my medical decision making (see chart for details).  Clinical Course   Pt with cutaneous abscess of the left knee area.  Abscess appears superficial and does not appear to involve the joint.  NV intact.  I&D performed and IV vancomycin given here.  Pt appears stable for d/c and I will prescribe   I personally performed the services described in this documentation, which was scribed in my presence. The recorded information has been reviewed and is accurate.   Final Clinical Impressions(s) / ED Diagnoses   Final diagnoses:  Cutaneous abscess of left lower extremity    New Prescriptions New Prescriptions   No medications on file     Pauline Ausammy Elon Lomeli, Cordelia Poche-C 11/29/15 1635    Vanetta MuldersScott Zackowski, MD 12/01/15 2145

## 2015-11-26 NOTE — ED Triage Notes (Signed)
Pt reports left knee pain x2 days. Pt reports noticed a small "pimple" noted to left knee and reports increased redness and swelling. Pt reports increased pain with movement.

## 2015-11-26 NOTE — Discharge Instructions (Signed)
Use the crutches for weight bearing.  Keep the area clean and bandaged.  Frequent warm wet soaks or compresses to your knee.  Return here in 2 days for recheck.

## 2015-11-26 NOTE — ED Notes (Signed)
Patient transported to X-ray 

## 2016-03-05 ENCOUNTER — Emergency Department (HOSPITAL_COMMUNITY): Payer: No Typology Code available for payment source

## 2016-03-05 ENCOUNTER — Encounter (HOSPITAL_COMMUNITY): Payer: Self-pay | Admitting: *Deleted

## 2016-03-05 DIAGNOSIS — Y92410 Unspecified street and highway as the place of occurrence of the external cause: Secondary | ICD-10-CM | POA: Diagnosis not present

## 2016-03-05 DIAGNOSIS — Z5321 Procedure and treatment not carried out due to patient leaving prior to being seen by health care provider: Secondary | ICD-10-CM | POA: Insufficient documentation

## 2016-03-05 DIAGNOSIS — S199XXA Unspecified injury of neck, initial encounter: Secondary | ICD-10-CM | POA: Diagnosis present

## 2016-03-05 DIAGNOSIS — M542 Cervicalgia: Secondary | ICD-10-CM | POA: Diagnosis not present

## 2016-03-05 DIAGNOSIS — F17210 Nicotine dependence, cigarettes, uncomplicated: Secondary | ICD-10-CM | POA: Insufficient documentation

## 2016-03-05 DIAGNOSIS — Y9389 Activity, other specified: Secondary | ICD-10-CM | POA: Diagnosis not present

## 2016-03-05 DIAGNOSIS — Y999 Unspecified external cause status: Secondary | ICD-10-CM | POA: Diagnosis not present

## 2016-03-05 DIAGNOSIS — M79604 Pain in right leg: Secondary | ICD-10-CM | POA: Diagnosis not present
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
## 2016-03-06 ENCOUNTER — Emergency Department (HOSPITAL_COMMUNITY)
Admission: EM | Admit: 2016-03-06 | Discharge: 2016-03-06 | Disposition: A | Discharge status: Home / Self Care | Payer: No Typology Code available for payment source | Attending: Dermatology | Admitting: Dermatology

## 2016-03-06 NOTE — ED Notes (Signed)
No answer in waiting areas.

## 2016-03-06 NOTE — ED Notes (Signed)
Registration clerk reports pt stated he was leaving

## 2016-03-17 ENCOUNTER — Encounter: Payer: Self-pay | Admitting: *Deleted

## 2016-03-17 ENCOUNTER — Emergency Department
Admission: EM | Admit: 2016-03-17 | Discharge: 2016-03-18 | Disposition: A | Discharge status: Other Acute Inpatient Hospital | Payer: Self-pay | Attending: Student in an Organized Health Care Education/Training Program | Admitting: Student in an Organized Health Care Education/Training Program

## 2016-03-17 DIAGNOSIS — R45850 Homicidal ideations: Secondary | ICD-10-CM

## 2016-03-17 DIAGNOSIS — R45851 Suicidal ideations: Secondary | ICD-10-CM

## 2016-03-17 DIAGNOSIS — F17210 Nicotine dependence, cigarettes, uncomplicated: Secondary | ICD-10-CM | POA: Insufficient documentation

## 2016-03-17 DIAGNOSIS — L03113 Cellulitis of right upper limb: Secondary | ICD-10-CM | POA: Insufficient documentation

## 2016-03-17 DIAGNOSIS — Z5181 Encounter for therapeutic drug level monitoring: Secondary | ICD-10-CM | POA: Insufficient documentation

## 2016-03-17 DIAGNOSIS — F1994 Other psychoactive substance use, unspecified with psychoactive substance-induced mood disorder: Secondary | ICD-10-CM | POA: Insufficient documentation

## 2016-03-17 LAB — URINE DRUG SCREEN, QUALITATIVE (ARMC ONLY)
AMPHETAMINES, UR SCREEN: NOT DETECTED
BARBITURATES, UR SCREEN: NOT DETECTED
BENZODIAZEPINE, UR SCRN: NOT DETECTED
CANNABINOID 50 NG, UR ~~LOC~~: POSITIVE — AB
Cocaine Metabolite,Ur ~~LOC~~: POSITIVE — AB
MDMA (Ecstasy)Ur Screen: NOT DETECTED
Methadone Scn, Ur: NOT DETECTED
OPIATE, UR SCREEN: NOT DETECTED
PHENCYCLIDINE (PCP) UR S: NOT DETECTED
Tricyclic, Ur Screen: NOT DETECTED

## 2016-03-17 LAB — SALICYLATE LEVEL: Salicylate Lvl: <7 mg/dL (ref 2.8–30.0)

## 2016-03-17 LAB — COMPREHENSIVE METABOLIC PANEL
ALBUMIN: 4.8 g/dL (ref 3.5–5.0)
ALK PHOS: 62 U/L (ref 38–126)
ALT: 13 U/L — ABNORMAL LOW (ref 17–63)
ANION GAP: 8 (ref 5–15)
AST: 18 U/L (ref 15–41)
BILIRUBIN TOTAL: 0.5 mg/dL (ref 0.3–1.2)
BUN: 14 mg/dL (ref 6–20)
CALCIUM: 9.6 mg/dL (ref 8.9–10.3)
CO2: 27 mmol/L (ref 22–32)
Chloride: 105 mmol/L (ref 101–111)
Creatinine, Ser: 1.12 mg/dL (ref 0.61–1.24)
GFR calc Af Amer: >60 mL/min (ref >60)
GLUCOSE: 108 mg/dL — AB (ref 65–99)
POTASSIUM: 3.5 mmol/L (ref 3.5–5.1)
Sodium: 140 mmol/L (ref 135–145)
TOTAL PROTEIN: 7.5 g/dL (ref 6.5–8.1)

## 2016-03-17 LAB — CBC
HEMATOCRIT: 45.6 % (ref 40.0–52.0)
Hemoglobin: 15.9 g/dL (ref 13.0–18.0)
MCH: 32.8 pg (ref 26.0–34.0)
MCHC: 34.8 g/dL (ref 32.0–36.0)
MCV: 94.2 fL (ref 80.0–100.0)
PLATELETS: 220 10*3/uL (ref 150–440)
RBC: 4.84 MIL/uL (ref 4.40–5.90)
RDW: 12.7 % (ref 11.5–14.5)
WBC: 8 10*3/uL (ref 3.8–10.6)

## 2016-03-17 LAB — ACETAMINOPHEN LEVEL: Acetaminophen (Tylenol), Serum: <10 ug/mL — ABNORMAL LOW (ref 10–30)

## 2016-03-17 LAB — ETHANOL

## 2016-03-17 MED ORDER — BACITRACIN ZINC 500 UNIT/GM EX OINT
TOPICAL_OINTMENT | CUTANEOUS | Status: AC
  Filled 2016-03-17: qty 0.9

## 2016-03-17 MED ORDER — BACITRACIN ZINC 500 UNIT/GM EX OINT
TOPICAL_OINTMENT | Freq: Once | CUTANEOUS | Status: AC
  Administered 2016-03-17: 23:00:00 via TOPICAL

## 2016-03-17 MED ORDER — LORAZEPAM 1 MG PO TABS
1.0000 mg | ORAL_TABLET | Freq: Once | ORAL | Status: AC
  Administered 2016-03-17: 1 mg via ORAL
  Filled 2016-03-17: qty 1

## 2016-03-17 NOTE — ED Triage Notes (Signed)
Pt states he is SI or HI.  Pt reports drug use today.  Denies etoh.  Pt cooperative.

## 2016-03-17 NOTE — ED Provider Notes (Signed)
[facility] Emergency Department Provider Note    None    (approximate)  I have reviewed the triage vital signs and the nursing notes.   HISTORY  Chief Complaint Suicidal    HPI Linwood DibblesJoey Kunde is a 25 y.o. male with history of polysubstance abuse as well as MDD and anxiety presents with SI and HI for the past several days. Patient states he has access to firearms and kill himself and others. Patient states he is recently divorced from his wife and is undergoing a lot of stress at work and at home. Patient states he's been using Percocets, Valium and cocaine. Denies any hallucinations. No recent fevers.   Past Medical History:  Diagnosis Date  . Anxiety attack   . MDD (major depressive disorder)    No family history on file. No past surgical history on file. There are no active problems to display for this patient.     Prior to Admission medications   Medication Sig Start Date End Date Taking? Authorizing Provider  citalopram (CELEXA) 20 MG tablet Take 1 tablet (20 mg total) by mouth daily. 08/19/15 08/18/16  Sharyn CreamerMark Quale, MD  diazepam (VALIUM) 2 MG tablet Take 1 tablet (2 mg total) by mouth every 6 (six) hours as needed for muscle spasms. 09/21/15 09/20/16  Myrna Blazeravid Matthew Schaevitz, MD  HYDROcodone-acetaminophen (NORCO/VICODIN) 5-325 MG tablet Take one-two tabs po q 4-6 hrs prn pain 11/26/15   Tammy Triplett, PA-C  ibuprofen (ADVIL,MOTRIN) 800 MG tablet Take 1 tablet (800 mg total) by mouth 3 (three) times daily. 11/26/15   Tammy Triplett, PA-C    Allergies Seroquel [quetiapine] and Zyrtec [cetirizine]    Social History Social History  Substance Use Topics  . Smoking status: Current Every Day Smoker    Packs/day: 0.50    Types: Cigarettes  . Smokeless tobacco: Never Used  . Alcohol use No    Review of Systems Patient denies headaches, rhinorrhea, blurry vision, numbness, shortness of breath, chest pain, edema, cough, abdominal pain, nausea, vomiting,  diarrhea, dysuria, fevers, rashes or hallucinations unless otherwise stated above in HPI. ____________________________________________   PHYSICAL EXAM:  VITAL SIGNS: Vitals:   03/17/16 2225  BP: 120/77  Pulse: 94  Resp: 18  Temp: 98.3 F (36.8 C)    Constitutional: Alert and oriented. Agitated appearing Eyes: Conjunctivae are normal. PERRL. EOMI. Head: Atraumatic. Nose: No congestion/rhinnorhea. Mouth/Throat: Mucous membranes are moist.  Oropharynx non-erythematous. Neck: No stridor. Painless ROM. No cervical spine tenderness to palpation Hematological/Lymphatic/Immunilogical: No cervical lymphadenopathy. Cardiovascular: Normal rate, regular rhythm. Grossly normal heart sounds.  Good peripheral circulation. Respiratory: Normal respiratory effort.  No retractions. Lungs CTAB. Gastrointestinal: Soft and nontender. No distention. No abdominal bruits. No CVA tenderness. Genitourinary:  Musculoskeletal: No lower extremity tenderness nor edema.  No joint effusions. Neurologic:  Normal speech and language. No gross focal neurologic deficits are appreciated. No gait instability. Skin:  Skin is warm, dry and intact. No rash noted. Psychiatric: agitated, pressured speech, eyes darting across room  ____________________________________________   LABS (all labs ordered are listed, but only abnormal results are displayed)  Results for orders placed or performed during the hospital encounter of 03/17/16 (from the past 24 hour(s))  cbc     Status: None   Collection Time: 03/17/16 10:27 PM  Result Value Ref Range   WBC 8.0 3.8 - 10.6 K/uL   RBC 4.84 4.40 - 5.90 MIL/uL   Hemoglobin 15.9 13.0 - 18.0 g/dL   HCT 95.645.6 21.340.0 - 08.652.0 %  MCV 94.2 80.0 - 100.0 fL   MCH 32.8 26.0 - 34.0 pg   MCHC 34.8 32.0 - 36.0 g/dL   RDW 40.912.7 81.111.5 - 91.414.5 %   Platelets 220 150 - 440 K/uL    ____________________________________________  EKG____________________________________________  RADIOLOGY   ____________________________________________   PROCEDURES  Procedure(s) performed: none Procedures    Critical Care performed: no ____________________________________________   INITIAL IMPRESSION / ASSESSMENT AND PLAN / ED COURSE  Pertinent labs & imaging results that were available during my care of the patient were reviewed by me and considered in my medical decision making (see chart for details).  DDX: Psychosis, delirium, medication effect, noncompliance, polysubstance abuse, Si, Hi, depression   Aurelio BrashJoey Duff is a 25 y.o. who presents to the ED with for evaluation of SI/HI.  Patient has psych history of MDD, anxiety, Polysubstance abuse.  Laboratory testing was ordered to evaluation for underlying electrolyte derangement or signs of underlying organic pathology to explain today's presentation.  Based on history and physical and laboratory evaluation, it appears that the patient's presentation is 2/2 underlying psychiatric disorder and will require further evaluation and management by inpatient psychiatry.  Patient was  made an IVC due to SI and HI with plan.  Disposition pending psychiatric evaluation.   Clinical Course      ____________________________________________   FINAL CLINICAL IMPRESSION(S) / ED DIAGNOSES  Final diagnoses:  Suicidal ideation  Homicidal ideation      NEW MEDICATIONS STARTED DURING THIS VISIT:  New Prescriptions   No medications on file     Note:  This document was prepared using Dragon voice recognition software and may include unintentional dictation errors.    Willy EddyPatrick Jaklyn Alen, MD 03/17/16 2253

## 2016-03-18 ENCOUNTER — Inpatient Hospital Stay
Admission: EM | Admit: 2016-03-18 | Discharge: 2016-03-21 | DRG: 881 | Disposition: A | Discharge status: Home / Self Care | Payer: No Typology Code available for payment source | Source: Intra-hospital | Attending: Psychiatry | Admitting: Psychiatry

## 2016-03-18 DIAGNOSIS — F32A Depression, unspecified: Secondary | ICD-10-CM

## 2016-03-18 DIAGNOSIS — Z881 Allergy status to other antibiotic agents status: Secondary | ICD-10-CM

## 2016-03-18 DIAGNOSIS — F1320 Sedative, hypnotic or anxiolytic dependence, uncomplicated: Secondary | ICD-10-CM | POA: Diagnosis present

## 2016-03-18 DIAGNOSIS — F1420 Cocaine dependence, uncomplicated: Secondary | ICD-10-CM | POA: Diagnosis present

## 2016-03-18 DIAGNOSIS — G4700 Insomnia, unspecified: Secondary | ICD-10-CM | POA: Diagnosis present

## 2016-03-18 DIAGNOSIS — R45851 Suicidal ideations: Secondary | ICD-10-CM | POA: Diagnosis present

## 2016-03-18 DIAGNOSIS — F17210 Nicotine dependence, cigarettes, uncomplicated: Secondary | ICD-10-CM | POA: Diagnosis present

## 2016-03-18 DIAGNOSIS — F4310 Post-traumatic stress disorder, unspecified: Secondary | ICD-10-CM | POA: Diagnosis present

## 2016-03-18 DIAGNOSIS — F13239 Sedative, hypnotic or anxiolytic dependence with withdrawal, unspecified: Secondary | ICD-10-CM

## 2016-03-18 DIAGNOSIS — F17200 Nicotine dependence, unspecified, uncomplicated: Secondary | ICD-10-CM

## 2016-03-18 DIAGNOSIS — F1994 Other psychoactive substance use, unspecified with psychoactive substance-induced mood disorder: Secondary | ICD-10-CM

## 2016-03-18 DIAGNOSIS — F1120 Opioid dependence, uncomplicated: Secondary | ICD-10-CM

## 2016-03-18 DIAGNOSIS — F329 Major depressive disorder, single episode, unspecified: Secondary | ICD-10-CM | POA: Diagnosis present

## 2016-03-18 DIAGNOSIS — Z7901 Long term (current) use of anticoagulants: Secondary | ICD-10-CM | POA: Diagnosis not present

## 2016-03-18 DIAGNOSIS — R4587 Impulsiveness: Secondary | ICD-10-CM | POA: Diagnosis present

## 2016-03-18 DIAGNOSIS — F1220 Cannabis dependence, uncomplicated: Secondary | ICD-10-CM | POA: Diagnosis present

## 2016-03-18 DIAGNOSIS — Z888 Allergy status to other drugs, medicaments and biological substances status: Secondary | ICD-10-CM | POA: Diagnosis not present

## 2016-03-18 DIAGNOSIS — F13939 Sedative, hypnotic or anxiolytic use, unspecified with withdrawal, unspecified: Secondary | ICD-10-CM

## 2016-03-18 MED ORDER — DIPHENHYDRAMINE HCL 25 MG PO CAPS
50.0000 mg | ORAL_CAPSULE | Freq: Once | ORAL | Status: AC
  Administered 2016-03-18: 50 mg via ORAL

## 2016-03-18 MED ORDER — ACETAMINOPHEN 325 MG PO TABS: 650.0000 mg | ORAL_TABLET | Freq: Four times a day (QID) | ORAL | Status: DC | PRN

## 2016-03-18 MED ORDER — SULFAMETHOXAZOLE-TRIMETHOPRIM 800-160 MG PO TABS: 1.0000 | ORAL_TABLET | Freq: Two times a day (BID) | ORAL | 0 refills | Status: DC

## 2016-03-18 MED ORDER — DIVALPROEX SODIUM 500 MG PO DR TAB
500.0000 mg | DELAYED_RELEASE_TABLET | Freq: Two times a day (BID) | ORAL | Status: DC
Start: 2016-03-19 — End: 2016-03-19
  Filled 2016-03-18: qty 1

## 2016-03-18 MED ORDER — DIVALPROEX SODIUM 500 MG PO DR TAB
500.0000 mg | DELAYED_RELEASE_TABLET | Freq: Two times a day (BID) | ORAL | Status: DC
  Filled 2016-03-18 (×2): qty 1

## 2016-03-18 MED ORDER — MAGNESIUM HYDROXIDE 400 MG/5ML PO SUSP: 30.0000 mL | Freq: Every day | ORAL | Status: DC | PRN

## 2016-03-18 MED ORDER — DIPHENHYDRAMINE HCL 25 MG PO CAPS
ORAL_CAPSULE | ORAL | Status: AC
  Administered 2016-03-18: 50 mg via ORAL
  Filled 2016-03-18: qty 2

## 2016-03-18 MED ORDER — ALUM & MAG HYDROXIDE-SIMETH 200-200-20 MG/5ML PO SUSP: 30.0000 mL | ORAL | Status: DC | PRN

## 2016-03-18 MED ORDER — CEPHALEXIN 500 MG PO CAPS: 500.0000 mg | ORAL_CAPSULE | Freq: Three times a day (TID) | ORAL | 0 refills | Status: DC

## 2016-03-18 MED ORDER — SULFAMETHOXAZOLE-TRIMETHOPRIM 800-160 MG PO TABS
1.0000 | ORAL_TABLET | Freq: Two times a day (BID) | ORAL | Status: DC
  Administered 2016-03-18: 1 via ORAL
  Filled 2016-03-18 (×3): qty 1

## 2016-03-18 MED ORDER — CEPHALEXIN 500 MG PO CAPS
500.0000 mg | ORAL_CAPSULE | Freq: Three times a day (TID) | ORAL | Status: DC
  Administered 2016-03-18: 500 mg via ORAL
  Filled 2016-03-18: qty 1

## 2016-03-18 NOTE — ED Provider Notes (Signed)
-----------------------------------------   10:58 AM on 03/18/2016 -----------------------------------------   Blood pressure 122/73, pulse 77, temperature 98.3 F (36.8 C), temperature source Oral, resp. rate 18, height 5\' 8"  (1.727 m), weight 160 lb (72.6 kg), SpO2 100 %.  The patient had no acute events since last update.  Calm and cooperative at this time.  Disposition is pending Psychiatry/Behavioral Medicine team recommendations.     Sharman CheekPhillip Philipp Callegari, MD 03/18/16 1058

## 2016-03-18 NOTE — ED Notes (Signed)

## 2016-03-18 NOTE — Tx Team (Signed)
Initial Treatment Plan 03/18/2016 10:35 PM Linwood DibblesJoey Roston ZOX:[id number]    PATIENT STRESSORS: Loss of father and brother    PATIENT STRENGTHS: General fund of knowledge Supportive family/friends   PATIENT IDENTIFIED PROBLEMS: "to look at some things different in life."  "to get something good out of this."  Anxiety  Suicidal ideations   Homicidal Ideations   Depression   Polysubstance abuse          DISCHARGE CRITERIA:  Improved stabilization in mood, thinking, and/or behavior  PRELIMINARY DISCHARGE PLAN: Outpatient therapy  PATIENT/FAMILY INVOLVEMENT: This treatment plan has been presented to and reviewed with the patient, Linwood DibblesJoey Reilly, and/or family member.  The patient and family have been given the opportunity to ask questions and make suggestions.  Lendell Capriceasey N Ephraim Reichel, RN 03/18/2016, 10:35 PM

## 2016-03-18 NOTE — ED Notes (Signed)

## 2016-03-18 NOTE — ED Notes (Signed)
Patient come to writer complaining of itching and he had just took ABT for skin infection. EDP notified. Verbal order for benadryl 50 mg PO one time. Verbal order to D/C ABT.

## 2016-03-18 NOTE — BH Assessment (Signed)
Assessment Note  David DibblesJoey Lowe is an 25 y.o. male. David Lowe reports that he arrived to the ED by personal transportation from his grandfather.  He states that he felt like "killing somebody and myself too". He reports having these feelings for the past 24 hours. He states that it started as feelings of anger that have grown. He shared that he believed that he could "really hurt some people". He states that the feelings are from out the blue and that these people have not done anything wrong to him, but he is particular in who he would harm.  He declined to identify who he wanted to hurt.  He reports that he is feeling somewhat depressed.  He states, "I know it ain't normal to feel like that". He reports facing every day life stressors. He denied having auditory or visual hallucinations. He reports being able to visualize himself harming others. He denied the use of alcohol. He states that he uses Cocaine, Xanax,  Oxcycodone, Percocet, Hydrocodone.  He states that he has been using "off and on for about 10 years".   Diagnosis: Bipolar Disorder, SI, HI, Substance abuse  Past Medical History:  Past Medical History:  Diagnosis Date  . Anxiety attack   . MDD (major depressive disorder)     No past surgical history on file.  Family History: No family history on file.  Social History:  reports that he has been smoking Cigarettes.  He has been smoking about 0.50 packs per day. He has never used smokeless tobacco. He reports that he does not drink alcohol or use drugs.  Additional Social History:  Alcohol / Drug Use History of alcohol / drug use?: Yes Substance #1 Name of Substance 1: Pills/Cocaine 1 - Age of First Use: 12 1 - Amount (size/oz): Varied amount of pills, "a gram or 2 of cocaine a day" 1 - Frequency: daily 1 - Last Use / Amount: 03/17/2016  CIWA: CIWA-Ar BP: 120/77 Pulse Rate: 94 COWS:    Allergies:  Allergies  Allergen Reactions  . Seroquel [Quetiapine] Itching  . Zyrtec  [Cetirizine] Other (See Comments)    Makes patient stay awake    Home Medications:  (Not in a hospital admission)  OB/GYN Status:  No LMP for male patient.  General Assessment Data [facility] Assessment: In system Is this a Tele or Face-to-Face Assessment?: Face-to-Face Is this an Initial Assessment or a Re-assessment for this encounter?: Initial Assessment Marital status: Separated Maiden name: n/a Is patient pregnant?: No Pregnancy Status: No Living Arrangements: Other relatives (Aunt) Can pt return to current living arrangement?: Yes Admission Status: Voluntary Is patient capable of signing voluntary admission?: Yes Referral Source: Self/Family/Friend Insurance type: Medicaid  Medical Screening Exam [facility] Walk-[facility] ONLY) Medical Exam completed: Yes  Crisis Care Plan Living Arrangements: Other relatives Midwife(Aunt) Legal Guardian: Other: (Self) Name of Psychiatrist: None at this time ([facility] Name of Therapist: None at this time ([facility])  Education Status Is patient currently in school?: No Current Grade: n/a Highest grade of school patient has completed: GED Name of school: [employer] Contact person: n/a  Risk to self with the past 6 months Suicidal Ideation: Yes-Currently Present Has patient been a risk to self within the past 6 months prior to admission? : No Suicidal Intent: No-Not Currently/Within Last 6 Months Has patient had any suicidal intent within the past 6 months prior to admission? : No Is patient at risk for suicide?: No Suicidal  Plan?: Yes-Currently Present ("I'd probably shoot myself") Has patient had any suicidal plan within the past 6 months prior to admission? : Yes Specify Current Suicidal Plan: shoot himself Access to Means: No (Reports grandfather has his weapons) What has been your use of drugs/alcohol within the last 12 months?: daily use of cocaine and pills Previous  Attempts/Gestures: No How many times?: 0 Other Self Harm Risks: denied Triggers for Past Attempts: None known Intentional Self Injurious Behavior: None Family Suicide History: Yes (Cousin) Recent stressful life event(s):  (denied) Persecutory voices/beliefs?: No Depression: Yes Depression Symptoms: Feeling worthless/self pity, Loss of interest in usual pleasures Substance abuse history and/or treatment for substance abuse?: Yes Suicide prevention information given to non-admitted patients: Not applicable  Risk to Others within the past 6 months Homicidal Ideation: Yes-Currently Present Does patient have any lifetime risk of violence toward others beyond the six months prior to admission? : Unknown (states accusations were made, but denied violence) Thoughts of Harm to Others: Yes-Currently Present Current Homicidal Intent: No Current Homicidal Plan: Yes-Currently Present Describe Current Homicidal Plan: "I'd just shoot them" Access to Homicidal Means: No Identified Victim: Refused to identify History of harm to others?: No Assessment of Violence: None Noted Violent Behavior Description: denied Does patient have access to weapons?: No Criminal Charges Pending?: Yes Describe Pending Criminal Charges: discharging a firearm with intent to incite fear, obtaining property under false pretense, a few marijuana charges Does patient have a court date: Yes Court Date: 05/08/16 Is patient on probation?: No  Psychosis Hallucinations: None noted Delusions: None noted  Mental Status Report Appearance/Hygiene: In scrubs Eye Contact: Fair Motor Activity: Freedom of movement Speech: Logical/coherent Level of Consciousness: Alert Mood: Depressed Affect: Appropriate to circumstance Anxiety Level: None Thought Processes: Coherent Judgement: Partial Orientation: Person, Place, Time, Situation Obsessive Compulsive Thoughts/Behaviors: None  Cognitive Functioning Concentration:  Normal Memory: Recent Intact IQ: Average Insight: Fair Impulse Control: Fair Appetite: Fair Sleep: Decreased Vegetative Symptoms: None  ADLScreening [facility] Assessment Services) Patient's cognitive ability adequate to safely complete daily activities?: Yes Patient able to express need for assistance with ADLs?: Yes Independently performs ADLs?: Yes (appropriate for developmental age)  Prior Inpatient Therapy Prior Inpatient Therapy: Yes Prior Therapy Dates: 2007 Prior Therapy Facilty/Provider(s): Unsure Reason for Treatment: bipolar  Prior Outpatient Therapy Prior Outpatient Therapy: Yes Prior Therapy Dates: 2017 Prior Therapy Facilty/Provider(s): [facility] Reason for Treatment: Bipolar Does patient have an ACCT team?: No Does patient have Intensive In-House Services?  : No Does patient have [facility] services? : No Does patient have P4CC services?: No  ADL Screening (condition at time of admission) Patient's cognitive ability adequate to safely complete daily activities?: Yes Patient able to express need for assistance with ADLs?: Yes Independently performs ADLs?: Yes (appropriate for developmental age)       Abuse/Neglect Assessment (Assessment to be complete while patient is alone) Physical Abuse: Denies Verbal Abuse: Denies Sexual Abuse: Denies Exploitation of patient/patient's resources: Denies Self-Neglect: Denies     [employer] officer[employer]vance Directives (For Healthcare) Does Patient Have a Medical Advance Directive?: No    Additional Information 1:1 In Past 12 Months?: No CIRT Risk: No Elopement Risk: No Does patient have medical clearance?: Yes     Disposition:  Disposition Initial Assessment Completed for this Encounter: Yes  On Site Evaluation by:   Reviewed with Physician:    Justice DeedsKeisha Arabella Lowe 03/18/2016 12:01 AM

## 2016-03-18 NOTE — ED Notes (Signed)
Pt. Alert and oriented, warm and dry, in no distress. Pt. Denies SI, and AVH. Pt is still reporting vague feeling of HI with no plan to act on thoughts. Patient contracts for safety. Patient took ABT for a red area on right wrist. Patient broke out in hives and was itching. Verbal orders for benadryl was received and given with positive results. Pt made aware of transfer to [facility]. Patient is in agreement with plan. [facility] to [facility] at 9:45 pm  Pt. Encouraged to let nursing staff know of any concerns or needs.

## 2016-03-18 NOTE — Progress Notes (Signed)
Patient ID: David Lowe, male   DOB: 1990/07/12, 25 y.o.   MRN: [id number] [facility] having some SI and HI. Patient stated he didn't have HI towards any particular person but just anybody. States he is also a drug user and says he uses cocaine, percocet, xanax, and marijuna. Patient contracts for safety. No specific plan but states one of his plans was "suicide by cop." Stressors include his father and brother died in [place]. States he makes irrational decisions. Trouble sleeping and wakes up in a panic. Patient oriented to the unit. Nourishment provided. Patient cooperative with admission. Safety maintained with 15 min checks.

## 2016-03-18 NOTE — ED Notes (Signed)
[facility] Patient made aware of transfer and is in an agreement.
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
## 2016-03-18 NOTE — ED Notes (Signed)
Pt seen by Dr Scotty CourtStafford due to c/o of a red and swollen area on his right wrist pt states possible and insect or spider bite , Dr will order medication

## 2016-03-18 NOTE — ED Notes (Signed)
Pt. To BHU from ED ambulatory without difficulty, to room  . Report from RN. Pt. Is alert and oriented, warm and dry in no distress. Pt. Denies SI, and AVH. Pt states he is having HI but not toward any one person. Pt. Calm and cooperative. Pt. Made aware of security cameras and Q15 minute rounds. Pt. Encouraged to let Nursing staff know of any concerns or needs.

## 2016-03-18 NOTE — ED Notes (Signed)
ED BHU PLACEMENT JUSTIFICATION Is the patient under IVC or is there intent for IVC: No. Is the patient medically cleared: Yes.   Is there vacancy in the ED BHU: Yes.   Is the population mix appropriate for patient: Yes.   Is the patient awaiting placement in inpatient or outpatient setting: No. Has the patient had a psychiatric consult: No. Survey of unit performed for contraband, proper placement and condition of furniture, tampering with fixtures in bathroom, shower, and each patient room: Yes.  ; Findings: NA APPEARANCE/BEHAVIOR calm, cooperative and adequate rapport can be established NEURO ASSESSMENT Orientation: time, place and person Hallucinations: No.None noted (Hallucinations) Speech: Normal Gait: normal RESPIRATORY ASSESSMENT Normal expansion.  Clear to auscultation.  No rales, rhonchi, or wheezing. CARDIOVASCULAR ASSESSMENT regular rate and rhythm, S1, S2 normal, no murmur, click, rub or gallop GASTROINTESTINAL ASSESSMENT soft, nontender, BS WNL, no r/g EXTREMITIES normal strength, tone, and muscle mass PLAN OF CARE Provide calm/safe environment. Vital signs assessed twice daily. ED BHU Assessment once each 12-hour shift. Collaborate with intake RN daily or as condition indicates. Assure the ED provider has rounded once each shift. Provide and encourage hygiene. Provide redirection as needed. Assess for escalating behavior; address immediately and inform ED provider.  Assess family dynamic and appropriateness for visitation as needed: Yes.  ; If necessary, describe findings: NA Educate the patient/family about BHU procedures/visitation: Yes.  ; If necessary, describe findings: NA  

## 2016-03-18 NOTE — ED Notes (Signed)
Dr Toni Amendlapacs has seen pt and pt is to be adm the patient is agreeable to this plan

## 2016-03-19 DIAGNOSIS — F1320 Sedative, hypnotic or anxiolytic dependence, uncomplicated: Secondary | ICD-10-CM

## 2016-03-19 DIAGNOSIS — F17200 Nicotine dependence, unspecified, uncomplicated: Secondary | ICD-10-CM

## 2016-03-19 DIAGNOSIS — F1220 Cannabis dependence, uncomplicated: Secondary | ICD-10-CM

## 2016-03-19 DIAGNOSIS — F13239 Sedative, hypnotic or anxiolytic dependence with withdrawal, unspecified: Secondary | ICD-10-CM

## 2016-03-19 DIAGNOSIS — F13939 Sedative, hypnotic or anxiolytic use, unspecified with withdrawal, unspecified: Secondary | ICD-10-CM

## 2016-03-19 DIAGNOSIS — F1420 Cocaine dependence, uncomplicated: Secondary | ICD-10-CM

## 2016-03-19 DIAGNOSIS — F329 Major depressive disorder, single episode, unspecified: Principal | ICD-10-CM

## 2016-03-19 DIAGNOSIS — F1120 Opioid dependence, uncomplicated: Secondary | ICD-10-CM

## 2016-03-19 DIAGNOSIS — F32A Depression, unspecified: Secondary | ICD-10-CM

## 2016-03-19 LAB — LIPID PANEL
Cholesterol: 152 mg/dL (ref 0–200)
HDL: 43 mg/dL (ref >40)
LDL CALC: 92 mg/dL (ref 0–99)
TRIGLYCERIDES: 83 mg/dL (ref <150)
Total CHOL/HDL Ratio: 3.5 RATIO
VLDL: 17 mg/dL (ref 0–40)

## 2016-03-19 LAB — TSH: TSH: 1.643 u[IU]/mL (ref 0.350–4.500)

## 2016-03-19 MED ORDER — CHLORDIAZEPOXIDE HCL 25 MG PO CAPS
25.0000 mg | ORAL_CAPSULE | Freq: Three times a day (TID) | ORAL | Status: DC
  Administered 2016-03-20 – 2016-03-21 (×3): 25 mg via ORAL
  Filled 2016-03-19 (×5): qty 1

## 2016-03-19 MED ORDER — SULFAMETHOXAZOLE-TRIMETHOPRIM 800-160 MG PO TABS
1.0000 | ORAL_TABLET | Freq: Two times a day (BID) | ORAL | Status: DC
  Administered 2016-03-19 – 2016-03-21 (×4): 1 via ORAL
  Filled 2016-03-19 (×6): qty 1

## 2016-03-19 MED ORDER — CITALOPRAM HYDROBROMIDE 20 MG PO TABS
20.0000 mg | ORAL_TABLET | Freq: Every day | ORAL | Status: DC
  Administered 2016-03-19 – 2016-03-21 (×3): 20 mg via ORAL
  Filled 2016-03-19 (×5): qty 1

## 2016-03-19 MED ORDER — CHLORDIAZEPOXIDE HCL 25 MG PO CAPS
25.0000 mg | ORAL_CAPSULE | Freq: Four times a day (QID) | ORAL | Status: AC
  Administered 2016-03-19 (×3): 25 mg via ORAL
  Filled 2016-03-19 (×3): qty 1

## 2016-03-19 MED ORDER — ENSURE ENLIVE PO LIQD
237.0000 mL | Freq: Two times a day (BID) | ORAL | Status: DC
  Administered 2016-03-19 – 2016-03-20 (×3): 237 mL via ORAL

## 2016-03-19 MED ORDER — TRAZODONE HCL 100 MG PO TABS
100.0000 mg | ORAL_TABLET | Freq: Every day | ORAL | Status: DC
  Filled 2016-03-19: qty 1

## 2016-03-19 MED ORDER — ONDANSETRON 8 MG PO TBDP
8.0000 mg | ORAL_TABLET | Freq: Three times a day (TID) | ORAL | Status: DC | PRN
  Filled 2016-03-19: qty 1

## 2016-03-19 NOTE — [facility] Group Notes (Signed)
[facility] Group Notes:  (Nursing/MHT/Case Management/Adjunct)  Date:  03/19/2016  Time:  4:15 PM  Type of Therapy:  Group Therapy  Participation Level:  Active  Participation Quality:  Appropriate and Attentive  Affect:  Appropriate  Cognitive:  Alert, Appropriate and Oriented  Insight:  Appropriate  Engagement in Group:  Engaged  Modes of Intervention:  Activity  Summary of Progress/Problems:  David Lowe David Lowe 03/19/2016, 4:15 PM

## 2016-03-19 NOTE — Progress Notes (Signed)
Recreation Therapy Notes  Date: 11.29.17 Time: 9:30 am Location: Craft Room  Group Topic: Self-esteem  Goal Area(s) Addresses:  Patient will write at least one positive trait about self. Patient will verbalize benefit of having healthy self-esteem.  Behavioral Response: Attentive, Left early  Intervention: I Am  Activity: Patients were given a worksheet with the letter I on it and were instructed to write positive traits about themselves inside the letter.  Education: LRT educated patients on ways to increase their self-esteem.  Education Outcome: Patient left before LRT educated group.  Clinical Observations/Feedback: Patient worked on Film/video editorworksheet. Patient left group at approximately 9:48 am stating he did not feel well and was coming off of drugs. Patient did not return to group.  Jacquelynn CreeGreene,Hulon Ferron M, LRT/CTRS 03/19/2016 10:06 AM

## 2016-03-19 NOTE — Tx Team (Signed)
Interdisciplinary Treatment and Diagnostic Plan Update  03/19/2016 Time of Session: 10:30AM David DibblesJoey Lowe MRN: [id number]  Principal Diagnosis: Depression  Secondary Diagnoses: Principal Problem:   Unspecified depressive disorder Active Problems:   Cocaine use disorder, severe, dependence (HCC)   Opioid use disorder, moderate, dependence (HCC)   Cannabis use disorder, severe, dependence (HCC)   Tobacco use disorder   Sedative, hypnotic or anxiolytic dependence (HCC)   Benzodiazepine withdrawal (HCC)   Current Medications:  Current Facility-Administered Medications  Medication Dose Route Frequency Provider Last Rate Last Dose  . acetaminophen (TYLENOL) tablet 650 mg  650 mg Oral Q6H PRN Audery AmelJohn T Clapacs, MD      . alum & mag hydroxide-simeth (MAALOX/MYLANTA) 200-200-20 MG/5ML suspension 30 mL  30 mL Oral Q4H PRN Audery AmelJohn T Clapacs, MD      . chlordiazePOXIDE (LIBRIUM) capsule 25 mg  25 mg Oral QID Jimmy FootmanAndrea Hernandez-Gonzalez, MD      . Melene Muller[START ON 03/20/2016] chlordiazePOXIDE (LIBRIUM) capsule 25 mg  25 mg Oral TID Jimmy FootmanAndrea Hernandez-Gonzalez, MD      . citalopram (CELEXA) tablet 20 mg  20 mg Oral Daily Jimmy FootmanAndrea Hernandez-Gonzalez, MD      . magnesium hydroxide (MILK OF MAGNESIA) suspension 30 mL  30 mL Oral Daily PRN Audery AmelJohn T Clapacs, MD      . ondansetron (ZOFRAN-ODT) disintegrating tablet 8 mg  8 mg Oral Q8H PRN Jimmy FootmanAndrea Hernandez-Gonzalez, MD      . sulfamethoxazole-trimethoprim (BACTRIM DS,SEPTRA DS) 800-160 MG per tablet 1 tablet  1 tablet Oral BID Jimmy FootmanAndrea Hernandez-Gonzalez, MD      . traZODone (DESYREL) tablet 100 mg  100 mg Oral QHS Jimmy FootmanAndrea Hernandez-Gonzalez, MD       PTA Medications: Prescriptions Prior to Admission  Medication Sig Dispense Refill Last Dose  . citalopram (CELEXA) 20 MG tablet Take 1 tablet (20 mg total) by mouth daily. (Patient not taking: Reported on 03/18/2016) 14 tablet 0 Not Taking at Unknown time  . diazepam (VALIUM) 2 MG tablet Take 1 tablet (2 mg total) by mouth every 6  (six) hours as needed for muscle spasms. (Patient not taking: Reported on 03/18/2016) 5 tablet 0 Not Taking at Unknown time  . HYDROcodone-acetaminophen (NORCO/VICODIN) 5-325 MG tablet Take one-two tabs po q 4-6 hrs prn pain (Patient not taking: Reported on 03/18/2016) 10 tablet 0 Not Taking at Unknown time  . ibuprofen (ADVIL,MOTRIN) 800 MG tablet Take 1 tablet (800 mg total) by mouth 3 (three) times daily. (Patient not taking: Reported on 03/18/2016) 21 tablet 0 Not Taking at Unknown time  . sulfamethoxazole-trimethoprim (BACTRIM DS) 800-160 MG tablet Take 1 tablet by mouth 2 (two) times daily. 14 tablet 0     Patient Stressors: Loss of father and brother   Patient Strengths: General fund of knowledge Supportive family/friends  Treatment Modalities: Medication Management, Group therapy, Case management,  1 to 1 session with clinician, Psychoeducation, Recreational therapy.   Physician Treatment Plan for Primary Diagnosis: Depression Long Term Goal(s):     Short Term Goals:    Medication Management: Evaluate patient's response, side effects, and tolerance of medication regimen.  Therapeutic Interventions: 1 to 1 sessions, Unit Group sessions and Medication administration.  Evaluation of Outcomes: Progressing  Physician Treatment Plan for Secondary Diagnosis: Principal Problem:   Unspecified depressive disorder Active Problems:   Cocaine use disorder, severe, dependence (HCC)   Opioid use disorder, moderate, dependence (HCC)   Cannabis use disorder, severe, dependence (HCC)   Tobacco use disorder   Sedative, hypnotic or anxiolytic dependence (HCC)  Benzodiazepine withdrawal (HCC)  Long Term Goal(s):     Short Term Goals:       Medication Management: Evaluate patient's response, side effects, and tolerance of medication regimen.  Therapeutic Interventions: 1 to 1 sessions, Unit Group sessions and Medication administration.  Evaluation of Outcomes: Progressing   RN  Treatment Plan for Primary Diagnosis: Depression Long Term Goal(s): Knowledge of disease and therapeutic regimen to maintain health will improve  Short Term Goals: Ability to remain free from injury will improve, Ability to verbalize frustration and anger appropriately will improve, Ability to demonstrate self-control, Ability to disclose and discuss suicidal ideas and Compliance with prescribed medications will improve  Medication Management: RN will administer medications as ordered by provider, will assess and evaluate patient's response and provide education to patient for prescribed medication. RN will report any adverse and/or side effects to prescribing provider.  Therapeutic Interventions: 1 on 1 counseling sessions, Psychoeducation, Medication administration, Evaluate responses to treatment, Monitor vital signs and CBGs as ordered, Perform/monitor CIWA, COWS, AIMS and Fall Risk screenings as ordered, Perform wound care treatments as ordered.  Evaluation of Outcomes: Progressing   LCSW Treatment Plan for Primary Diagnosis: Depression Long Term Goal(s): Safe transition to appropriate next level of care at discharge, Engage patient in therapeutic group addressing interpersonal concerns.  Short Term Goals: Engage patient in aftercare planning with referrals and resources, Increase social support, Facilitate acceptance of mental health diagnosis and concerns, Identify triggers associated with mental health/substance abuse issues and Increase skills for wellness and recovery  Therapeutic Interventions: Assess for all discharge needs, 1 to 1 time with Social worker, Explore available resources and support systems, Assess for adequacy in community support network, Educate family and significant other(s) on suicide prevention, Complete Psychosocial Assessment, Interpersonal group therapy.  Evaluation of Outcomes: Progressing   Progress in Treatment: Attending groups: Yes. Participating in  groups: Yes. Taking medication as prescribed: Yes. Toleration medication: Yes. Family/Significant other contact made: No, will contact:  grandfather will be contacted. Patient understands diagnosis: Yes. Discussing patient identified problems/goals with staff: Yes. Medical problems stabilized or resolved: Yes. Denies suicidal/homicidal ideation: Yes. Issues/concerns per patient self-inventory: No.  New problem(s) identified: No, Describe:  None identified.  New Short Term/Long Term Goal(s):  Discharge Plan or Barriers:   Reason for Continuation of Hospitalization: Anxiety Depression Withdrawal symptoms  Estimated Length of Stay: 3-5 days   Attendees: Patient: David DibblesJoey Beman 03/19/2016 11:20 AM  Physician: Dr. Radene JourneyAndrea Hernandez, MD 03/19/2016 11:20 AM  Nursing: Leonia ReaderPhyllis Cobb, BSN, RN 03/19/2016 11:20 AM  RN Care Manager: Philbert Riserori Terry, BSN, RN 03/19/2016 11:20 AM  Social Worker: Hampton AbbotKadijah Satya Buttram, MSW, LCSW-A 03/19/2016 11:20 AM  Recreational Therapist: Hershal CoriaBeth Greene, LRT, CTRS  03/19/2016 11:20 AM   Scribe for Treatment Team: Lynden OxfordKadijah R Dillian Feig, LCSWA 03/19/2016 11:20 AM

## 2016-03-19 NOTE — [facility] Group Notes (Signed)
[facility] LCSW Group Therapy  03/19/2016 3:08 PM  Type of Therapy:  Group Therapy  Participation Level:  Active  Participation Quality:  Appropriate and Sharing  Affect:  Appropriate  Cognitive:  Appropriate  Insight:  Engaged  Engagement in Therapy:  Engaged  Modes of Intervention:  Activity, Discussion, Education and Support  Summary of Progress/Problems:Emotional Regulation: Patients will identify both negative and positive emotions. They will discuss emotions they have difficulty regulating and how they impact their lives. Patients will be asked to identify healthy coping skills to combat unhealthy reactions to negative emotions.   Shanequia Kendrick G. Garnette CzechSampson MSW, LCSWA 03/19/2016, 3:09 PM

## 2016-03-19 NOTE — Progress Notes (Signed)
Patient ID: David Lowe, male   DOB: [birth year], 25 y.o.   MRN: [id number] Skin Assessment and Contrabands check completed; gross skin intact except for a tattoo site "of a Cross and the word Family" on the upper left breast. No contrabands found on the patient or his belongings.
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
## 2016-03-19 NOTE — Progress Notes (Signed)
Patient refused depakote.  Refused to take bactrim stating that he had a reaction to it on yesterday.   Dr. Ardyth HarpsHernandez notified.  Dr. Ardyth HarpsHernandez states "It was the Keflex"  Informed patient of this and patient states "I took both, how does she know which one I had a reaction to, and I don't want to break out again."  Isolative to room first half of a shift.  After lunch out and about in the milieu. Interacting with peers.  Verbalizes that feels much better and is ready to go home and that he is going to be patient and take meds so that he can do just that.

## 2016-03-19 NOTE — Progress Notes (Signed)
NUTRITION ASSESSMENT  Pt identified as at risk on the Malnutrition Screen Tool  INTERVENTION: 1. Educated patient on the importance of nutrition and encouraged intake of food and beverages. 2. Discussed weight goals. 3. Supplements: Ensure Enlive po BID, each supplement provides 350 kcal and 20 grams of protein  NUTRITION DIAGNOSIS: Unintentional weight loss related to sub-optimal intake as evidenced by pt report.   Goal: Pt to meet >/= 90% of their estimated nutrition needs.  Monitor:  PO intake  Assessment:  David DibblesJoey Lowe is a  25 y.o. male with a history of substance induced mood disorder. He presented voluntarily to the emergency department seeking help for depression, suicidality, and homicidality on November 28th He denies poor appetite currently but does admit to weight loss, related to drug use. He states when he uses drugs he loses weight and when he doesn't he gains weight. Per chart his weight has fluctuated over the past 7 months from 130#-165# - he is now 143# PO intake thus far has been good, documented at 90 and 100% He likes Ensure, will provide during stay.  Height: Ht Readings from Last 1 Encounters:  03/18/16 5\' 8"  (1.727 m)    Weight: Wt Readings from Last 1 Encounters:  03/18/16 143 lb (64.9 kg)    Weight Hx: Wt Readings from Last 10 Encounters:  03/18/16 143 lb (64.9 kg)  03/17/16 160 lb (72.6 kg)  03/05/16 165 lb (74.8 kg)  11/26/15 155 lb (70.3 kg)  11/15/15 152 lb (68.9 kg)  09/21/15 135 lb (61.2 kg)  08/19/15 130 lb (59 kg)  08/27/14 150 lb (68 kg)    BMI:  Body mass index is 21.74 kg/m. Pt meets criteria for normal weight based on current BMI.  Estimated Nutritional Needs: Kcal: 25-30 kcal/kg Protein: > 1 gram protein/kg Fluid: 1 ml/kcal  Diet Order: Diet regular Room service appropriate? Yes; Fluid consistency: Thin Pt is also offered choice of unit snacks mid-morning and mid-afternoon.  Pt is eating as desired.   Lab results and  medications reviewed.   Dionne AnoWilliam M. Allisa Einspahr, MS, RD LDN Inpatient Clinical Dietitian Pager [phone]

## 2016-03-19 NOTE — [facility] Suicide Risk Assessment (Signed)
[facility]   Nursing information obtained from:  Patient Demographic factors:  Male, Adolescent or young adult, Caucasian, Low socioeconomic status, Unemployed Current Mental Status:  Suicidal ideation indicated by patient, Self-harm thoughts, Suicide plan, Thoughts of violence towards others Loss Factors:  Legal issues, Financial problems / change in socioeconomic status (court date January 16th for discharge firearm ) Historical Factors:  Family history of mental illness or substance abuse Risk Reduction Factors:  Positive social support  Total Time spent with patient:  Principal Problem: Depression Diagnosis:   Patient Active Problem List   Diagnosis Date Noted  . Unspecified depressive disorder [F32.9] 03/19/2016  . Cocaine use disorder, severe, dependence (HCC) [F14.20] 03/19/2016  . Opioid use disorder, moderate, dependence (HCC) [F11.20] 03/19/2016  . Cannabis use disorder, severe, dependence (HCC) [F12.20] 03/19/2016  . Tobacco use disorder [F17.200] 03/19/2016  . 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 Benzodiazepine withdrawal (HCC) [F13.239] 03/19/2016   Subjective Data:   Continued Clinical Symptoms:  Alcohol Use Disorder Identification Test Final Score (AUDIT): 0 The "Alcohol Use Disorders Identification Test", Guidelines for Use in Primary Care, Second Edition.  World Science writerHealth Organization [facility](WHO). Score between 0-7:  no or low risk or alcohol related problems. Score between 8-15:  moderate risk of alcohol related problems. Score between 16-19:  high risk of alcohol related problems. Score 20 or above:  warrants further diagnostic evaluation for alcohol dependence and treatment.   CLINICAL FACTORS:   Severe Anxiety and/or Agitation Depression:   Impulsivity Alcohol/Substance Abuse/Dependencies Previous Psychiatric Diagnoses and Treatments   M Psychiatric Specialty Exam: Physical Exam  ROS  Blood  pressure 128/82, pulse 69, temperature 98.1 F (36.7 C), resp. rate 18, height 5\' 8"  (1.727 m), weight 64.9 kg (143 lb), SpO2 100 %.Body mass index is 21.74 kg/m.                                                    Sleep:  Number of Hours: 7      COGNITIVE FEATURES THAT CONTRIBUTE TO RISK:  Closed-mindedness    SUICIDE RISK:   Mild:  Suicidal ideation of limited frequency, intensity, duration, and specificity.  There are no identifiable plans, no associated intent, mild dysphoria and related symptoms, good self-control (both objective and subjective assessment), few other risk factors, and identifiable protective factors, including available and accessible social support.   [facility]  I certify that inpatient services furnished can reasonably be expected to improve the patient's condition.  Jimmy FootmanHernandez-Gonzalez,  Karem Farha, MD 03/19/2016, 1:23 PM
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
## 2016-03-20 LAB — PROLACTIN: PROLACTIN: 12.3 ng/mL (ref 4.0–15.2)

## 2016-03-20 LAB — HEMOGLOBIN A1C
Hgb A1c MFr Bld: 5 % (ref 4.8–5.6)
MEAN PLASMA GLUCOSE: 97 mg/dL

## 2016-03-20 MED ORDER — CITALOPRAM HYDROBROMIDE 20 MG PO TABS: 20.0000 mg | ORAL_TABLET | Freq: Every day | ORAL | 0 refills | Status: DC

## 2016-03-20 MED ORDER — MIRTAZAPINE 15 MG PO TABS: 15.0000 mg | ORAL_TABLET | Freq: Every day | ORAL | 0 refills | Status: DC

## 2016-03-20 MED ORDER — SULFAMETHOXAZOLE-TRIMETHOPRIM 800-160 MG PO TABS: 1.0000 | ORAL_TABLET | Freq: Two times a day (BID) | ORAL | 0 refills | Status: AC

## 2016-03-20 MED ORDER — MIRTAZAPINE 15 MG PO TABS
15.0000 mg | ORAL_TABLET | Freq: Every day | ORAL | Status: DC
  Administered 2016-03-20: 15 mg via ORAL
  Filled 2016-03-20: qty 1

## 2016-03-20 NOTE — Progress Notes (Signed)
D: Observed pt in dayroom interacting with peers. Patient alert and oriented x4. Patient denies SI/HI/AVH. Pt affect anxious. Pt stated his mood is "clearer than it's been in awhile." Pt indicated he was more active today on the unit and that his withdrawal symptoms have decreased. Pt c/o of minor sweating, but had no other complaints. Pt mentioned concerns in regards to getting "itchy...hives" when taking Bactrim. A: Offered active listening and support. Provided therapeutic communication. Administered scheduled medications. Ensured pt that the doctor would be called for an order if itchiness or hives developed. Encouraged pt to continue being active on the unit and interacting with peers R: Pt pleasant and cooperative. Pt medication compliant. Pt initially complained of itching after the Bacrtim, but within a short period of time said it had stopped. No hives noted.  Will continue Q15 min. checks. Safety maintained.
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
## 2016-03-20 NOTE — Progress Notes (Signed)
Recreation Therapy Notes  Date: 11.30.17 Time: 1:00 pm Location: Craft Room  Group Topic: Leisure Education  Goal Area(s) Addresses:  Patient will identify activities for each letter of the alphabet. Patient will verbalize ability to integrate positive leisure into life post d/c. Patient will verbalize ability to use leisure as a Associate Professorcoping skill.  Behavioral Response: Attentive, Interactive  Intervention: Leisure Alphabet  Activity: Patients were given a Leisure Information systems managerAlphabet worksheet and were instructed to write healthy leisure activities for each letter of the alphabet.  Education: LRT educated patients on what they need to participate in leisure.  Education Outcome: Acknowledges education/In group clarification offered  Clinical Observations/Feedback: Patient wrote healthy leisure activities. Patient contributed to group discussion by stating some of his healthy leisure activities, and what he needs to participate in leisure.  Jacquelynn CreeGreene,David Lowe, LRT/CTRS 03/20/2016 4:34 PM

## 2016-03-20 NOTE — BHH Group Notes (Signed)
[facility] Group Notes:  (Nursing/MHT/Case Management/Adjunct)  Date:  03/20/2016  Time:  4:05 PM  Type of Therapy:  Psychoeducational Skills  Participation Level:  Did Not Attend  David Lowe C [name] 03/20/2016, 4:05 PM 

## 2016-03-20 NOTE — [facility] Suicide Risk Assessment (Signed)
[facility] INPATIENT:  Family/Significant Other Suicide Prevention Education  Suicide Prevention Education:  Education Completed;grandfather, Wannetta SenderRandy Schlender ph#: [phone](336) [phone]  has been identified by the patient as the family member/significant other with whom the patient will be residing, and identified as the person(s) who will aid the patient in the event of a mental health crisis (suicidal ideations/suicide attempt).  With written consent from the patient, the family member/significant other has been provided the following suicide prevention education, prior to the and/or following the discharge of the patient.  The suicide prevention education provided includes the following:  Suicide risk factors  Suicide prevention and interventions  National Suicide Hotline telephone number  [facility] assessment telephone number  [facility] Emergency Assistance 911  [facility] and/or Residential Mobile Crisis Unit telephone number  Request made of family/significant other to:  Remove weapons (e.g., guns, rifles, knives), all items previously/currently identified as safety concern.    Remove drugs/medications (over-the-counter, prescriptions, illicit drugs), all items previously/currently identified as a safety concern.  The family member/significant other verbalizes understanding of the suicide prevention education information provided.  The family member/significant other agrees to remove the items of safety concern listed above.  Lynden OxfordKadijah R Erandy Mceachern, MSW, LCSW-A 03/20/2016, 3:21 PM

## 2016-03-20 NOTE — [facility] Group Notes (Signed)
[facility] LCSW Group Therapy  03/20/2016 10:45 AM  Type of Therapy:  Group Therapy  Participation Level:  Active  Participation Quality:  Appropriate and Sharing  Affect:  Appropriate  Cognitive:  Alert  Insight:  Improving  Engagement in Therapy:  Engaged  Modes of Intervention:  Activity, Discussion, Education, Problem-solving, Reality Testing and Support  Summary of Progress/Problems:Balance in life: Patients will discuss the concept of balance and how it looks and feels to be unbalanced. Pt will identify areas in their life that is unbalanced and ways to become more balanced. They discussed what aspects in their lives has influenced their self care. Patients also discussed self care in the areas of self regulation/control, hygiene/appearance, sleep/relaxation, healthy leisure, healthy eating habits, exercise, inner peace/spirituality, self improvement, sobriety, and health management. They were challenged to identify changes that are needed in order to improve self care. Patient states he wants to work on sobriety to improve his decision making and interactions with others.   Sabria Florido G. Garnette CzechSampson MSW, LCSWA 03/20/2016, 10:46 AM

## 2016-03-20 NOTE — Progress Notes (Signed)
Presents with a bright affect.  Denies SI/HI/AVH. Verbalizes that he feels much better now that the drugs are out of his system.  Did not take librium at 12:00 pm and 5:00 pm stating that he does not need it.  Visible in the milieu.  Interacting with peers and staff appropriately.  Verbalizes that he is ready to go home.  Unable to verbalize what he is going to do to stay away from drugs.  States"I am an addict"  Support and encouragement offered. Safety maintained.

## 2016-03-20 NOTE — Plan of Care (Signed)
Problem: Activity: Goal: Interest or engagement in activities will improve Outcome: Progressing Pt was out of room most of evening interacting with peers.
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
## 2016-03-20 NOTE — [facility] Suicide Risk Assessment (Signed)
[facility] INPATIENT:  Family/Significant Other Suicide Prevention Education  Suicide Prevention Education:  Contact Attempts: grandfather, Wannetta SenderRandy Jaggers ph#: [phone] has been identified by the patient as the family member/significant other with whom the patient will be residing, and identified as the person(s) who will aid the patient in the event of a mental health crisis.  With written consent from the patient, two attempts were made to provide suicide prevention education, prior to and/or following the patient's discharge.  We were unsuccessful in providing suicide prevention education.  A suicide education pamphlet was given to the patient to share with family/significant other.  Date and time of first attempt: 03/19/2016 / 1:30PM Date and time of second attempt:03/20/2016 / 1:58PM  Lynden OxfordKadijah R Katriona Schmierer, MSW, LCSW-A 03/20/2016, 1:58 PM

## 2016-03-21 NOTE — [facility] Group Notes (Signed)
[facility] Group Notes:  (Nursing/MHT/Case Management/Adjunct)  Date:  03/21/2016  Time:  3:57 AM  Type of Therapy:  Psychoeducational Skills  Participation Level:  Active  Participation Quality:  Appropriate, Attentive and Sharing  Affect:  Appropriate  Cognitive:  Appropriate  Insight:  Appropriate and Good  Engagement in Group:  Engaged  Modes of Intervention:  Discussion, Socialization and Support  Summary of Progress/Problems:  David MilroyLaquanda Y [name] 03/21/2016, 3:57 AM

## 2016-03-21 NOTE — Progress Notes (Signed)
Patient left with sister with belongings to his home. Left with AVS, Suicide Risk Assessment, and Transition Record. Left ambulatory. Denied SI before discharge

## 2016-03-21 NOTE — Progress Notes (Signed)
D: Observed pt in dayroom interacting with peers. Patient alert and oriented x4. Patient denies SI/HI/AVH. Pt affect is appropriate. Pt refused Librium for previous shift, but pt indicated that he was feeling "sweaty and anxious" and asked for dose that pt previously refused at 1700. Pt had no other complaints. Pt indicated his mood is "pretty good."  A: Offered active listening and support. Provided therapeutic communication. Administered scheduled medications. Administered Librium dose that pt did not take at 1700. R: Pt pleasant and cooperative. Pt medication compliant. Will continue Q15 min. checks. Safety maintained.
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
## 2016-03-21 NOTE — Progress Notes (Signed)
Recreation Therapy Notes  INPATIENT RECREATION TR PLAN  Patient Details Name: David Lowe MRN: [id number] DOB: [birth year] Today's Date: 03/21/2016  Rec Therapy Plan Is patient appropriate for Therapeutic Recreation?: Yes Treatment times per week: At least once a week TR Treatment/Interventions: 1:1 session, Group participation (Comment) (Appropriate participation in daily recreational therapy tx)  Discharge Criteria Pt will be discharged from therapy if:: Treatment goals are met, Discharged Treatment plan/goals/alternatives discussed and agreed upon by:: Patient/family  Discharge Summary Short term goals set: See Care Plan Short term goals met: Complete Progress toward goals comments: One-to-one attended Which groups?: Leisure education One-to-one attended: Stress management, coping skills Reason goals not met: N/A Therapeutic equipment acquired: None Reason patient discharged from therapy: Discharge from hospital Pt/family agrees with progress & goals achieved: Yes Date patient discharged from therapy: 03/21/16   Leonette Monarch, LRT/CTRS 03/21/2016, 3:15 PM

## 2016-03-21 NOTE — Progress Notes (Signed)
  [facility] :  Will you be returning to the same living situation after discharge:  Yes,  pt will be return to [place] At discharge, do you have transportation home?: Yes,  pt will be picked up by family Do you have the ability to pay for your medications: Yes,  pt will be provided with prescriptions at discharge  Release of information consent forms completed and in the chart;  Patient's signature needed at discharge.  Patient to Follow up at: Follow-up Information    [facility]. Go on 03/24/2016.   Why:  [facility] follow-up [facility] on December 4[facility] at [facility]. After your assessment, they will schedule you [place] the doctor [place] [facility] for medication management. Contact information: Address: [address] [place], [place] [address] Ph#: [phone] Fax#: [phone]          Next level of care provider has access to [facility] Link:no  Safety Planning and Suicide Prevention discussed: Yes,  completed with pt  Have you used any form of tobacco in the last 30 days? (Cigarettes, Smokeless Tobacco, Cigars, and/or Pipes): Yes  Has patient been referred to the Quitline?: Patient refused referral  Patient has been referred for addiction treatment: Yes  David Lowe 03/21/2016, 2:04 PM
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
## 2016-03-21 NOTE — Plan of Care (Signed)
Problem: [facility] Participation in Recreation Therapeutic Interventions Goal: STG-Patient will identify at least five coping skills for ** STG: Coping Skills - Within 3 treatment sessions, patient will verbalize at least 5 coping skills for substance abuse in one treatment session to decrease substance abuse post d/c.  Outcome: Completed/Met Date Met: 03/21/16 Treatment Session 1; Completed 1 out of 1: At approximately 8:30 am, LRT met with patient in consultation room. Patient verbalized 5 coping skills for substance abuse. LRT educated patient on leisure and why it is important to implement it into his schedule. LRT educated and provided patient on blank schedules to help him plan his day and try to avoid using substances. LRT educated patient on healthy support systems.  Leonette Monarch, LRT/CTRS 12.01.17 3:11 pm Goal: STG-Other Recreation Therapy Goal (Specify) STG: Stress Management - Within 3 treatment sessions, patient will verbalize understanding of the stress management techniques in one treatment session to increase stress management skills post d/c.  Outcome: Completed/Met Date Met: 03/21/16 Treatment Session 1; Completed 1 out of 1: At approximately 8:30 am, LRT met with patient in consultation room. LRT educated and provided patient with stress management techniques. Patient verbalized understanding. LRT encouraged patient to read over and practice the stress management techniques.  Leonette Monarch, LRT/CTRS 12.01.17 3:13 pm

## 2016-03-21 NOTE — Tx Team (Signed)
Interdisciplinary Treatment and Diagnostic Plan Update  03/21/2016 Time of Session: 10:30AM David DibblesJoey Lowe MRN: [id number]  Principal Diagnosis: Depression  Secondary Diagnoses: Principal Problem:   Unspecified depressive disorder Active Problems:   Cocaine use disorder, severe, dependence (HCC)   Opioid use disorder, moderate, dependence (HCC)   Cannabis use disorder, severe, dependence (HCC)   Tobacco use disorder   Sedative, hypnotic or anxiolytic dependence (HCC)   Benzodiazepine withdrawal (HCC)   Current Medications:  Current Facility-Administered Medications  Medication Dose Route Frequency Provider Last Rate Last Dose  . acetaminophen (TYLENOL) tablet 650 mg  650 mg Oral Q6H PRN Audery AmelJohn T Clapacs, MD      . alum & mag hydroxide-simeth (MAALOX/MYLANTA) 200-200-20 MG/5ML suspension 30 mL  30 mL Oral Q4H PRN Audery AmelJohn T Clapacs, MD      . chlordiazePOXIDE (LIBRIUM) capsule 25 mg  25 mg Oral TID Jimmy FootmanAndrea Hernandez-Gonzalez, MD   25 mg at 03/21/16 0840  . citalopram (CELEXA) tablet 20 mg  20 mg Oral Daily Jimmy FootmanAndrea Hernandez-Gonzalez, MD   20 mg at 03/21/16 0840  . feeding supplement (ENSURE ENLIVE) (ENSURE ENLIVE) liquid 237 mL  237 mL Oral BID BM Jimmy FootmanAndrea Hernandez-Gonzalez, MD   237 mL at 03/20/16 1400  . magnesium hydroxide (MILK OF MAGNESIA) suspension 30 mL  30 mL Oral Daily PRN Audery AmelJohn T Clapacs, MD      . mirtazapine (REMERON) tablet 15 mg  15 mg Oral QHS Jimmy FootmanAndrea Hernandez-Gonzalez, MD   15 mg at 03/20/16 2118  . ondansetron (ZOFRAN-ODT) disintegrating tablet 8 mg  8 mg Oral Q8H PRN Jimmy FootmanAndrea Hernandez-Gonzalez, MD      . sulfamethoxazole-trimethoprim (BACTRIM DS,SEPTRA DS) 800-160 MG per tablet 1 tablet  1 tablet Oral BID Jimmy FootmanAndrea Hernandez-Gonzalez, MD   1 tablet at 03/21/16 [id number]   Current Outpatient Prescriptions  Medication Sig Dispense Refill  . citalopram (CELEXA) 20 MG tablet Take 1 tablet (20 mg total) by mouth daily. 30 tablet 0  . mirtazapine (REMERON) 15 MG tablet Take 1 tablet (15 mg  total) by mouth at bedtime. 30 tablet 0  . sulfamethoxazole-trimethoprim (BACTRIM DS,SEPTRA DS) 800-160 MG tablet Take 1 tablet by mouth 2 (two) times daily. 16 tablet 0   PTA Medications: No prescriptions prior to admission.    Patient Stressors: Loss of father and brother   Patient Strengths: General fund of knowledge Supportive family/friends  Treatment Modalities: Medication Management, Group therapy, Case management,  1 to 1 session with clinician, Psychoeducation, Recreational therapy.   Physician Treatment Plan for Primary Diagnosis: Depression Long Term Goal(s): Improvement in symptoms so as ready for discharge Improvement in symptoms so as ready for discharge   Short Term Goals: Ability to identify changes in lifestyle to reduce recurrence of condition will improve Ability to verbalize feelings will improve Ability to identify and develop effective coping behaviors will improve Ability to maintain clinical measurements within normal limits will improve Compliance with prescribed medications will improve Ability to identify triggers associated with substance abuse/mental health issues will improve Ability to identify changes in lifestyle to reduce recurrence of condition will improve Ability to demonstrate self-control will improve Ability to identify triggers associated with substance abuse/mental health issues will improve  Medication Management: Evaluate patient's response, side effects, and tolerance of medication regimen.  Therapeutic Interventions: 1 to 1 sessions, Unit Group sessions and Medication administration.  Evaluation of Outcomes: Adequate for discharge  Physician Treatment Plan for Secondary Diagnosis: Principal Problem:   Unspecified depressive disorder Active Problems:   Cocaine use disorder, severe, dependence (  HCC)   Opioid use disorder, moderate, dependence (HCC)   Cannabis use disorder, severe, dependence (HCC)   Tobacco use disorder    Sedative, hypnotic or anxiolytic dependence (HCC)   Benzodiazepine withdrawal (HCC)  Long Term Goal(s): Improvement in symptoms so as ready for discharge Improvement in symptoms so as ready for discharge   Short Term Goals: Ability to identify changes in lifestyle to reduce recurrence of condition will improve Ability to verbalize feelings will improve Ability to identify and develop effective coping behaviors will improve Ability to maintain clinical measurements within normal limits will improve Compliance with prescribed medications will improve Ability to identify triggers associated with substance abuse/mental health issues will improve Ability to identify changes in lifestyle to reduce recurrence of condition will improve Ability to demonstrate self-control will improve Ability to identify triggers associated with substance abuse/mental health issues will improve     Medication Management: Evaluate patient's response, side effects, and tolerance of medication regimen.  Therapeutic Interventions: 1 to 1 sessions, Unit Group sessions and Medication administration.  Evaluation of Outcomes: Adequate for discharge    RN Treatment Plan for Primary Diagnosis: Depression Long Term Goal(s): Knowledge of disease and therapeutic regimen to maintain health will improve  Short Term Goals: Ability to remain free from injury will improve, Ability to verbalize frustration and anger appropriately will improve, Ability to demonstrate self-control, Ability to disclose and discuss suicidal ideas and Compliance with prescribed medications will improve  Medication Management: RN will administer medications as ordered by provider, will assess and evaluate patient's response and provide education to patient for prescribed medication. RN will report any adverse and/or side effects to prescribing provider.  Therapeutic Interventions: 1 on 1 counseling sessions, Psychoeducation, Medication administration,  Evaluate responses to treatment, Monitor vital signs and CBGs as ordered, Perform/monitor CIWA, COWS, AIMS and Fall Risk screenings as ordered, Perform wound care treatments as ordered.  Evaluation of Outcomes: Adequate for discharge   LCSW Treatment Plan for Primary Diagnosis: Depression Long Term Goal(s): Safe transition to appropriate next level of care at discharge, Engage patient in therapeutic group addressing interpersonal concerns.  Short Term Goals: Engage patient in aftercare planning with referrals and resources, Increase social support, Facilitate acceptance of mental health diagnosis and concerns, Identify triggers associated with mental health/substance abuse issues and Increase skills for wellness and recovery  Therapeutic Interventions: Assess for all discharge needs, 1 to 1 time with Social worker, Explore available resources and support systems, Assess for adequacy in community support network, Educate family and significant other(s) on suicide prevention, Complete Psychosocial Assessment, Interpersonal group therapy.  Evaluation of Outcomes: Adequate for discharge   Progress in Treatment: Attending groups: Yes. Participating in groups: Yes. Taking medication as prescribed: Yes. Toleration medication: Yes. Family/Significant other contact made: Yes, grandfather contacted. Patient understands diagnosis: Yes. Discussing patient identified problems/goals with staff: Yes. Medical problems stabilized or resolved: Yes. Denies suicidal/homicidal ideation: Yes. Issues/concerns per patient self-inventory: No.  New problem(s) identified: No, Describe:  None identified.  New Short Term/Long Term Goal(s):  Discharge Plan or Barriers:   Reason for Continuation of Hospitalization: Anxiety Depression Withdrawal symptoms  Estimated Length of Stay:: ,Date of discharge 03/21/16 Attendees: Patient:  03/21/2016 2:01 PM  Physician: Dr. Radene Journey, MD 03/21/2016 2:01 PM   Nursing: Shelia Media, RN 03/21/2016 2:01 PM  RN Care Manager: 03/21/2016 2:01 PM  Social Worker: Dorothe Pea. Roylene Reason, LCAS        03/21/2016 2:01 PM  Recreational Therapist: Hershal Coria, LRT, CTRS  03/21/2016 2:01  PM   Scribe for Treatment Team: Mercy RidingJonathan F Anne-Marie Genson, Theresia MajorsLCSWA 03/21/2016 2:01 PM

## 2016-03-21 NOTE — [facility] Group Notes (Signed)
Patient did not participate in group therapy.   Jadan Rouillard LCSW 

## 2017-12-26 ENCOUNTER — Other Ambulatory Visit: Payer: Self-pay

## 2017-12-26 ENCOUNTER — Emergency Department (HOSPITAL_COMMUNITY)
Admission: EM | Admit: 2017-12-26 | Discharge: 2017-12-27 | Disposition: A | Discharge status: Other Acute Inpatient Hospital | Payer: Self-pay | Attending: Emergency Medicine | Admitting: Emergency Medicine

## 2017-12-26 ENCOUNTER — Encounter (HOSPITAL_COMMUNITY): Payer: Self-pay | Admitting: *Deleted

## 2017-12-26 DIAGNOSIS — F1010 Alcohol abuse, uncomplicated: Secondary | ICD-10-CM

## 2017-12-26 DIAGNOSIS — F1910 Other psychoactive substance abuse, uncomplicated: Secondary | ICD-10-CM

## 2017-12-26 DIAGNOSIS — F1220 Cannabis dependence, uncomplicated: Secondary | ICD-10-CM | POA: Insufficient documentation

## 2017-12-26 DIAGNOSIS — F17210 Nicotine dependence, cigarettes, uncomplicated: Secondary | ICD-10-CM | POA: Insufficient documentation

## 2017-12-26 DIAGNOSIS — F1020 Alcohol dependence, uncomplicated: Secondary | ICD-10-CM | POA: Insufficient documentation

## 2017-12-26 DIAGNOSIS — F332 Major depressive disorder, recurrent severe without psychotic features: Secondary | ICD-10-CM | POA: Insufficient documentation

## 2017-12-26 DIAGNOSIS — Z79899 Other long term (current) drug therapy: Secondary | ICD-10-CM | POA: Insufficient documentation

## 2017-12-26 DIAGNOSIS — F1420 Cocaine dependence, uncomplicated: Secondary | ICD-10-CM | POA: Insufficient documentation

## 2017-12-26 DIAGNOSIS — F1320 Sedative, hypnotic or anxiolytic dependence, uncomplicated: Secondary | ICD-10-CM | POA: Insufficient documentation

## 2017-12-26 MED ORDER — LORAZEPAM 1 MG PO TABS: 0.0000 mg | ORAL_TABLET | Freq: Two times a day (BID) | ORAL | Status: DC

## 2017-12-26 MED ORDER — IBUPROFEN 400 MG PO TABS: 600.0000 mg | ORAL_TABLET | Freq: Three times a day (TID) | ORAL | Status: DC | PRN

## 2017-12-26 MED ORDER — VITAMIN B-1 100 MG PO TABS: 100.0000 mg | ORAL_TABLET | Freq: Every day | ORAL | Status: DC

## 2017-12-26 MED ORDER — LORAZEPAM 2 MG/ML IJ SOLN: 0.0000 mg | Freq: Four times a day (QID) | INTRAMUSCULAR | Status: DC

## 2017-12-26 MED ORDER — LORAZEPAM 1 MG PO TABS: 0.0000 mg | ORAL_TABLET | Freq: Four times a day (QID) | ORAL | Status: DC

## 2017-12-26 MED ORDER — NICOTINE 14 MG/24HR TD PT24: 14.0000 mg | MEDICATED_PATCH | Freq: Every day | TRANSDERMAL | Status: DC

## 2017-12-26 MED ORDER — THIAMINE HCL 100 MG/ML IJ SOLN: 100.0000 mg | Freq: Every day | INTRAMUSCULAR | Status: DC

## 2017-12-26 MED ORDER — LORAZEPAM 2 MG/ML IJ SOLN: 0.0000 mg | Freq: Two times a day (BID) | INTRAMUSCULAR | Status: DC

## 2017-12-26 NOTE — ED Triage Notes (Signed)
Pt states " I am trying to detox from xanax and alcohol, I am also having thoughts of SI and HI" when asked about a plan pt states that he is unsure.

## 2017-12-27 ENCOUNTER — Other Ambulatory Visit: Payer: Self-pay

## 2017-12-27 ENCOUNTER — Inpatient Hospital Stay (HOSPITAL_COMMUNITY)
Admission: AD | Admit: 2017-12-27 | Discharge: 2017-12-30 | DRG: 885 | Disposition: A | Discharge status: Home / Self Care | Payer: Federal, State, Local not specified - Other | Source: Intra-hospital | Attending: Psychiatry | Admitting: Psychiatry

## 2017-12-27 ENCOUNTER — Encounter (HOSPITAL_COMMUNITY): Payer: Self-pay | Admitting: *Deleted

## 2017-12-27 DIAGNOSIS — F410 Panic disorder [episodic paroxysmal anxiety] without agoraphobia: Secondary | ICD-10-CM | POA: Diagnosis present

## 2017-12-27 DIAGNOSIS — F1120 Opioid dependence, uncomplicated: Secondary | ICD-10-CM | POA: Diagnosis present

## 2017-12-27 DIAGNOSIS — F1310 Sedative, hypnotic or anxiolytic abuse, uncomplicated: Secondary | ICD-10-CM | POA: Diagnosis present

## 2017-12-27 DIAGNOSIS — F17210 Nicotine dependence, cigarettes, uncomplicated: Secondary | ICD-10-CM | POA: Diagnosis present

## 2017-12-27 DIAGNOSIS — F1220 Cannabis dependence, uncomplicated: Secondary | ICD-10-CM | POA: Diagnosis present

## 2017-12-27 DIAGNOSIS — Z888 Allergy status to other drugs, medicaments and biological substances status: Secondary | ICD-10-CM | POA: Diagnosis not present

## 2017-12-27 DIAGNOSIS — F1910 Other psychoactive substance abuse, uncomplicated: Secondary | ICD-10-CM | POA: Diagnosis present

## 2017-12-27 DIAGNOSIS — R45850 Homicidal ideations: Secondary | ICD-10-CM | POA: Diagnosis present

## 2017-12-27 DIAGNOSIS — R45851 Suicidal ideations: Secondary | ICD-10-CM | POA: Diagnosis not present

## 2017-12-27 DIAGNOSIS — F411 Generalized anxiety disorder: Secondary | ICD-10-CM | POA: Diagnosis present

## 2017-12-27 DIAGNOSIS — F1024 Alcohol dependence with alcohol-induced mood disorder: Secondary | ICD-10-CM

## 2017-12-27 DIAGNOSIS — G4700 Insomnia, unspecified: Secondary | ICD-10-CM | POA: Diagnosis present

## 2017-12-27 DIAGNOSIS — F1420 Cocaine dependence, uncomplicated: Secondary | ICD-10-CM | POA: Diagnosis present

## 2017-12-27 DIAGNOSIS — Z79899 Other long term (current) drug therapy: Secondary | ICD-10-CM | POA: Diagnosis not present

## 2017-12-27 DIAGNOSIS — F1490 Cocaine use, unspecified, uncomplicated: Secondary | ICD-10-CM | POA: Diagnosis not present

## 2017-12-27 DIAGNOSIS — Z599 Problem related to housing and economic circumstances, unspecified: Secondary | ICD-10-CM | POA: Diagnosis not present

## 2017-12-27 DIAGNOSIS — F333 Major depressive disorder, recurrent, severe with psychotic symptoms: Principal | ICD-10-CM

## 2017-12-27 DIAGNOSIS — F1290 Cannabis use, unspecified, uncomplicated: Secondary | ICD-10-CM | POA: Diagnosis not present

## 2017-12-27 DIAGNOSIS — F329 Major depressive disorder, single episode, unspecified: Secondary | ICD-10-CM | POA: Diagnosis present

## 2017-12-27 LAB — CBC WITH DIFFERENTIAL/PLATELET
BASOS PCT: 0 %
Basophils Absolute: 0 10*3/uL (ref 0.0–0.1)
EOS ABS: 0.1 10*3/uL (ref 0.0–0.7)
EOS PCT: 0 %
HCT: 42.9 % (ref 39.0–52.0)
HEMOGLOBIN: 15 g/dL (ref 13.0–17.0)
LYMPHS ABS: 2.3 10*3/uL (ref 0.7–4.0)
Lymphocytes Relative: 16 %
MCH: 32.8 pg (ref 26.0–34.0)
MCHC: 35 g/dL (ref 30.0–36.0)
MCV: 93.7 fL (ref 78.0–100.0)
MONOS PCT: 5 %
Monocytes Absolute: 0.7 10*3/uL (ref 0.1–1.0)
NEUTROS PCT: 79 %
Neutro Abs: 11.6 10*3/uL — ABNORMAL HIGH (ref 1.7–7.7)
PLATELETS: 187 10*3/uL (ref 150–400)
RBC: 4.58 MIL/uL (ref 4.22–5.81)
RDW: 12.8 % (ref 11.5–15.5)
WBC: 14.6 10*3/uL — AB (ref 4.0–10.5)

## 2017-12-27 LAB — COMPREHENSIVE METABOLIC PANEL
ALK PHOS: 58 U/L (ref 38–126)
ALT: 11 U/L (ref 0–44)
AST: 14 U/L — ABNORMAL LOW (ref 15–41)
Albumin: 4.4 g/dL (ref 3.5–5.0)
Anion gap: 8 (ref 5–15)
BILIRUBIN TOTAL: 0.4 mg/dL (ref 0.3–1.2)
BUN: 17 mg/dL (ref 6–20)
CHLORIDE: 108 mmol/L (ref 98–111)
CO2: 23 mmol/L (ref 22–32)
CREATININE: 0.85 mg/dL (ref 0.61–1.24)
Calcium: 9 mg/dL (ref 8.9–10.3)
GFR calc Af Amer: >60 mL/min (ref >60)
Glucose, Bld: 119 mg/dL — ABNORMAL HIGH (ref 70–99)
POTASSIUM: 3.5 mmol/L (ref 3.5–5.1)
Sodium: 139 mmol/L (ref 135–145)
Total Protein: 7.2 g/dL (ref 6.5–8.1)

## 2017-12-27 LAB — RAPID URINE DRUG SCREEN, HOSP PERFORMED
AMPHETAMINES: NOT DETECTED
BENZODIAZEPINES: NOT DETECTED
Barbiturates: NOT DETECTED
Cocaine: POSITIVE — AB
Opiates: NOT DETECTED
TETRAHYDROCANNABINOL: NOT DETECTED

## 2017-12-27 LAB — ETHANOL: Alcohol, Ethyl (B): 84 mg/dL — ABNORMAL HIGH (ref <10)

## 2017-12-27 MED ORDER — CHLORDIAZEPOXIDE HCL 25 MG PO CAPS
25.0000 mg | ORAL_CAPSULE | Freq: Four times a day (QID) | ORAL | Status: AC | PRN
  Administered 2017-12-28 (×2): 25 mg via ORAL
  Filled 2017-12-27: qty 1

## 2017-12-27 MED ORDER — OLANZAPINE 2.5 MG PO TABS
2.5000 mg | ORAL_TABLET | Freq: Every day | ORAL | Status: DC
  Administered 2017-12-27: 2.5 mg via ORAL
  Filled 2017-12-27 (×3): qty 1

## 2017-12-27 MED ORDER — MAGNESIUM HYDROXIDE 400 MG/5ML PO SUSP: 30.0000 mL | Freq: Every day | ORAL | Status: DC | PRN

## 2017-12-27 MED ORDER — MIRTAZAPINE 7.5 MG PO TABS
7.5000 mg | ORAL_TABLET | Freq: Every day | ORAL | Status: DC
  Administered 2017-12-27 – 2017-12-29 (×3): 7.5 mg via ORAL
  Filled 2017-12-27 (×3): qty 1
  Filled 2017-12-27: qty 7

## 2017-12-27 MED ORDER — ACETAMINOPHEN 325 MG PO TABS
650.0000 mg | ORAL_TABLET | Freq: Four times a day (QID) | ORAL | Status: DC | PRN
  Administered 2017-12-28: 650 mg via ORAL
  Filled 2017-12-27: qty 2

## 2017-12-27 MED ORDER — CHLORDIAZEPOXIDE HCL 25 MG PO CAPS
50.0000 mg | ORAL_CAPSULE | Freq: Once | ORAL | Status: AC
  Administered 2017-12-27: 50 mg via ORAL

## 2017-12-27 MED ORDER — VITAMIN B-1 100 MG PO TABS
100.0000 mg | ORAL_TABLET | Freq: Every day | ORAL | Status: DC
  Administered 2017-12-28 – 2017-12-30 (×3): 100 mg via ORAL
  Filled 2017-12-27 (×4): qty 1

## 2017-12-27 MED ORDER — TRAZODONE HCL 50 MG PO TABS: 50.0000 mg | ORAL_TABLET | Freq: Every evening | ORAL | Status: DC | PRN

## 2017-12-27 MED ORDER — CHLORDIAZEPOXIDE HCL 25 MG PO CAPS
25.0000 mg | ORAL_CAPSULE | Freq: Every day | ORAL | Status: AC
  Administered 2017-12-30: 25 mg via ORAL
  Filled 2017-12-27: qty 1

## 2017-12-27 MED ORDER — LOPERAMIDE HCL 2 MG PO CAPS: 2.0000 mg | ORAL_CAPSULE | ORAL | Status: AC | PRN

## 2017-12-27 MED ORDER — CHLORDIAZEPOXIDE HCL 25 MG PO CAPS
25.0000 mg | ORAL_CAPSULE | Freq: Three times a day (TID) | ORAL | Status: AC
  Administered 2017-12-28 (×2): 25 mg via ORAL
  Filled 2017-12-27 (×3): qty 1

## 2017-12-27 MED ORDER — ALUM & MAG HYDROXIDE-SIMETH 200-200-20 MG/5ML PO SUSP: 30.0000 mL | ORAL | Status: DC | PRN

## 2017-12-27 MED ORDER — CHLORDIAZEPOXIDE HCL 25 MG PO CAPS
25.0000 mg | ORAL_CAPSULE | Freq: Four times a day (QID) | ORAL | Status: AC
  Administered 2017-12-27 (×2): 25 mg via ORAL
  Filled 2017-12-27 (×2): qty 1

## 2017-12-27 MED ORDER — ADULT MULTIVITAMIN W/MINERALS CH
1.0000 | ORAL_TABLET | Freq: Every day | ORAL | Status: DC
  Administered 2017-12-28 – 2017-12-30 (×3): 1 via ORAL
  Filled 2017-12-27 (×5): qty 1

## 2017-12-27 MED ORDER — CHLORDIAZEPOXIDE HCL 25 MG PO CAPS
25.0000 mg | ORAL_CAPSULE | ORAL | Status: AC
  Administered 2017-12-29 (×2): 25 mg via ORAL
  Filled 2017-12-27 (×2): qty 1

## 2017-12-27 MED ORDER — THIAMINE HCL 100 MG/ML IJ SOLN: 100.0000 mg | Freq: Once | INTRAMUSCULAR | Status: DC

## 2017-12-27 MED ORDER — NICOTINE POLACRILEX 2 MG MT GUM
2.0000 mg | CHEWING_GUM | OROMUCOSAL | Status: DC | PRN
  Administered 2017-12-28 – 2017-12-30 (×5): 2 mg via ORAL
  Filled 2017-12-27 (×2): qty 1

## 2017-12-27 MED ORDER — CHLORDIAZEPOXIDE HCL 25 MG PO CAPS
ORAL_CAPSULE | ORAL | Status: AC
  Filled 2017-12-27: qty 2

## 2017-12-27 MED ORDER — ONDANSETRON 4 MG PO TBDP
4.0000 mg | ORAL_TABLET | Freq: Four times a day (QID) | ORAL | Status: AC | PRN
  Filled 2017-12-27: qty 1
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
## 2017-12-27 NOTE — [facility] Suicide Risk Assessment (Signed)
[facility] INPATIENT:  Family/Significant Other Suicide Prevention Education  Suicide Prevention Education: The patient originally gave consent for his grandfather to be the recipient of his [facility] suicide prevention information. This Clinical research associate telephoned [name] to start the conversation and quickly recognized that he was not aware that his grandson was at [facility]. Social worker ended the call and went to the patient and asked to call his grandfather. He refused stating he did not need to know and said that he misunderstood the purpose of the consent and now that  he did know he was revoking it.  Social worker encouraged him to call his family so they would not be worried with the patient replying " he knows I am straight". Patient Refusal for Family/Significant Other Suicide Prevention Education: [name] has revoked the  written consent for family/significant other to be provided Family/Significant Other Suicide Prevention Education during admission and/or prior to discharge.  Physician notified.  [name] 12/27/2017, 3:07 PM
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
## 2017-12-27 NOTE — ED Provider Notes (Signed)
[facility] EMERGENCY DEPARTMENT Provider Note   CSN: [id number] Arrival date & time: 12/26/17  2256     History   Chief Complaint Chief Complaint  Patient presents with  . V70.1    HPI David Lowe is a 27 y.o. male.  The history is provided by the patient.  Mental Health Problem  Presenting symptoms: suicidal thoughts   Degree of incapacity (severity):  Moderate Timing:  Constant Progression:  Worsening Chronicity:  New Context: alcohol use and drug abuse   Patient with history of anxiety and depression presents for alcohol and Xanax abuse.  He also mentioned thoughts of harming himself to nursing.  No fevers or vomiting.  No new headache.  He reports his last drink of alcohol was several hours ago.  He reports he abuses alcohol and Xanax daily for a long time.  He last used alcohol several hours ago  Past Medical History:  Diagnosis Date  . Anxiety attack   . MDD (major depressive disorder)     Patient Active Problem List   Diagnosis Date Noted  . Unspecified depressive disorder 03/19/2016  . Cocaine use disorder, severe, dependence (HCC) 03/19/2016  . Opioid use disorder, moderate, dependence (HCC) 03/19/2016  . Cannabis use disorder, severe, dependence (HCC) 03/19/2016  . Tobacco use disorder 03/19/2016  . 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 Benzodiazepine withdrawal (HCC) 03/19/2016    History reviewed. No pertinent surgical history.      Home Medications    Prior to Admission medications   Medication Sig Start Date End Date Taking? Authorizing Provider  citalopram (CELEXA) 20 MG tablet Take 1 tablet (20 mg total) by mouth daily. 03/21/16   [name], MD  mirtazapine (REMERON) 15 MG tablet Take 1 tablet (15 mg total) by mouth at bedtime. 03/20/16   [name], MD    Family History History reviewed. No pertinent family history.  Social History Social History   Tobacco Use  . Smoking status:  Current Every Day Smoker    Packs/day: 0.50    Types: Cigarettes  . Smokeless tobacco: Never Used  Substance Use Topics  . Alcohol use: Yes    Comment: last use today,   . Drug use: Yes    Types: Marijuana, Cocaine, Benzodiazepines    Comment: Takes xxanax - not prescribed, percocet      Allergies   Bactericin [bacitracin]; Keflet [cephalexin]; Seroquel [quetiapine]; and Zyrtec [cetirizine]   Review of Systems Review of Systems  Constitutional: Negative for fever.  Gastrointestinal: Negative for vomiting.  Psychiatric/Behavioral: Positive for suicidal ideas.  All other systems reviewed and are negative.    Physical Exam Updated Vital Signs BP 114/69   Pulse 67   Temp (!) 97.5 F (36.4 C) (Oral)   Resp 16   Ht 1.778 m (5\' 10" )   Wt 72.6 kg   SpO2 98%   BMI 22.96 kg/m   Physical Exam CONSTITUTIONAL: Disheveled HEAD: Normocephalic/atraumatic EYES: EOMI/PERRL ENMT: Mucous membranes moist NECK: supple no meningeal signs SPINE/BACK:entire spine nontender CV: S1/S2 noted, no murmurs/rubs/gallops noted LUNGS: Lungs are clear to auscultation bilaterally, no apparent distress ABDOMEN: soft, nontender NEURO: Pt is awake/alert/appropriate, moves all extremitiesx4.  No facial droop.   EXTREMITIES: pulses normal/equal, full ROM SKIN: warm, color normal PSYCH: no abnormalities of mood noted, alert and oriented to situation, good eye contact   ED Treatments / Results  Labs (all labs ordered are listed, but only abnormal results are displayed) Labs Reviewed  COMPREHENSIVE METABOLIC PANEL - Abnormal;  Notable for the following components:      Result Value   Glucose, Bld 119 (*)    AST 14 (*)    All other components within normal limits  ETHANOL - Abnormal; Notable for the following components:   Alcohol, Ethyl (B) 84 (*)    All other components within normal limits  RAPID URINE DRUG SCREEN, HOSP PERFORMED - Abnormal; Notable for the following components:   Cocaine  POSITIVE (*)    All other components within normal limits  CBC WITH DIFFERENTIAL/PLATELET - Abnormal; Notable for the following components:   WBC 14.6 (*)    Neutro Abs 11.6 (*)    All other components within normal limits    EKG None  Radiology No results found.  Procedures Procedures   Medications Ordered in ED Medications  LORazepam (ATIVAN) injection 0-4 mg (has no administration in time range)    Or  LORazepam (ATIVAN) tablet 0-4 mg (has no administration in time range)  LORazepam (ATIVAN) injection 0-4 mg (has no administration in time range)    Or  LORazepam (ATIVAN) tablet 0-4 mg (has no administration in time range)  thiamine (VITAMIN B-1) tablet 100 mg (has no administration in time range)    Or  thiamine (B-1) injection 100 mg (has no administration in time range)  ibuprofen (ADVIL,MOTRIN) tablet 600 mg (has no administration in time range)  nicotine (NICODERM CQ - dosed in mg/24 hours) patch 14 mg (has no administration in time range)     Initial Impression / Assessment and Plan / ED Course  I have reviewed the triage vital signs and the nursing notes.  Pertinent labs & imaging results that were available during my care of the patient were reviewed by me and considered in my medical decision making (see chart for details).     12:11 AM Pt stable at this time.  He will need psych consult.  Final Clinical Impressions(s) / ED Diagnoses   Final diagnoses:  Alcohol abuse  Substance abuse [facility])    ED Discharge Orders    None       Zadie Rhine, MD 12/27/17 [phone]

## 2017-12-27 NOTE — ED Notes (Signed)
Mother, [name], [phone]
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
## 2017-12-27 NOTE — [facility] Group Notes (Signed)
[facility] LCSW Group Therapy Note  12/27/2017  10:00-11:00AM  Type of Therapy and Topic:  Group Therapy:  Being Your Own Support  Participation Level:  Did Not Attend   Description of Group:  Patients in this group were introduced to the concept that self-support is an essential part of recovery.  A song entitled "My Own Hero" was played and a group discussion ensued in which patients stated they could relate to the song and it inspired them to realize they have be willing to help themselves in order to succeed, because other people cannot achieve sobriety or stability for them.  We discussed adding a variety of healthy supports to address the various needs in their lives.  A song was played called "I Know Where I've Been" toward the end of group and used to conduct an inspirational wrap-up to group of remembering how far they have already come in their journey.  Therapeutic Goals: 1)  demonstrate the importance of being a part of one's own support system 2)  discuss reasons people in one's life may eventually be unable to be continually supportive  3)  identify the patient's current support system and   4)  elicit commitments to add healthy supports and to become more conscious of being self-supportive   Summary of Patient Progress:  N/A   Therapeutic Modalities:   Motivational Interviewing Activity  Verdine Grenfell J Grossman-Orr       
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
## 2017-12-27 NOTE — ED Notes (Signed)
[facility] called at this time for transport to [facility], Mother called to notify of transfer to [facility] answer, unable to leave a msg

## 2017-12-27 NOTE — Progress Notes (Signed)
D.  Pt pleasant on approach, reports mild to moderate withdrawal symptoms at this time.  Pt was positive for evening AA group, observed engaged in appropriate interaction with peers on the unit.  Pt denies SI/HI/AVH at this time.  Pt does endorse racing thoughts and anxiety as primary withdrawal symptoms.  Pt denied need for nicotine replacement on admission but did request to receive the gum tonight.  Order put in for this and gum was given to Pt as requested.  A.  Support and encouragement offered, medication given as ordered for withdrawal symptoms.  R. Pt remains safe on the unit, will continue to monitor.

## 2017-12-27 NOTE — Progress Notes (Signed)
Admission note:  Pt is a 27 year old Caucasian male admitted to the services of Dr. Jama Flavors for alcohol and benzo detox.  Pt has also been using cocaine and marijuana.  Pt had been feeling suicidal without plan and increasingly depressed.  Pt states he lives with someone but in tele-assessment it is stated that he lives alone in a basement.  He has two children who live with their mother.  Pt lists his grandfather as his emergency contact.  He sates that his family is supportive and his mother came to hospital with him when he went to the ED.  Pt states he drinks up to 10 beers daily and drinks "all day".  He reports a past history of DTs but denies blackouts.  Pt states that he also uses xanax daily and has been for 10 years with his longest period of sobriety being 6 months last year.  Pt's UDS was positive for cocaine, BAL 84.  Pt is cooperative with the admission assessment.

## 2017-12-27 NOTE — ED Notes (Signed)
Pt off unit via [facility] to [facility] at this time

## 2017-12-27 NOTE — Tx Team (Signed)
Initial Treatment Plan 12/27/2017 4:14 AM Linwood Dibbles BSW:[id number]    PATIENT STRESSORS: Financial difficulties Substance abuse   PATIENT STRENGTHS: Active sense of humor Average or above average intelligence Communication skills Motivation for treatment/growth Supportive family/friends   PATIENT IDENTIFIED PROBLEMS: Substance abuse  Depression  Suicidal Ideation          "get clean"       DISCHARGE CRITERIA:  Improved stabilization in mood, thinking, and/or behavior Motivation to continue treatment in a less acute level of care Need for constant or close observation no longer present Verbal commitment to aftercare and medication compliance Withdrawal symptoms are absent or subacute and managed without 24-hour nursing intervention  PRELIMINARY DISCHARGE PLAN: Attend aftercare/continuing care group Attend 12-step recovery group Outpatient therapy Return to previous living arrangement  PATIENT/FAMILY INVOLVEMENT: This treatment plan has been presented to and reviewed with the patient, David Lowe  The patient and family have been given the opportunity to ask questions and make suggestions.  Juliann Pares, RN 12/27/2017, 4:14 AM

## 2017-12-28 DIAGNOSIS — F1490 Cocaine use, unspecified, uncomplicated: Secondary | ICD-10-CM

## 2017-12-28 DIAGNOSIS — F419 Anxiety disorder, unspecified: Secondary | ICD-10-CM

## 2017-12-28 DIAGNOSIS — F17210 Nicotine dependence, cigarettes, uncomplicated: Secondary | ICD-10-CM

## 2017-12-28 DIAGNOSIS — F333 Major depressive disorder, recurrent, severe with psychotic symptoms: Principal | ICD-10-CM

## 2017-12-28 DIAGNOSIS — R45851 Suicidal ideations: Secondary | ICD-10-CM

## 2017-12-28 DIAGNOSIS — F1310 Sedative, hypnotic or anxiolytic abuse, uncomplicated: Secondary | ICD-10-CM

## 2017-12-28 DIAGNOSIS — F1290 Cannabis use, unspecified, uncomplicated: Secondary | ICD-10-CM

## 2017-12-28 MED ORDER — HYDROXYZINE HCL 25 MG PO TABS
25.0000 mg | ORAL_TABLET | Freq: Three times a day (TID) | ORAL | Status: DC | PRN
  Administered 2017-12-28 – 2017-12-29 (×2): 25 mg via ORAL
  Filled 2017-12-28: qty 10
  Filled 2017-12-28 (×2): qty 1

## 2017-12-28 MED ORDER — OLANZAPINE 5 MG PO TABS
5.0000 mg | ORAL_TABLET | Freq: Every day | ORAL | Status: DC
  Administered 2017-12-28: 5 mg via ORAL
  Filled 2017-12-28 (×2): qty 1

## 2017-12-28 NOTE — Progress Notes (Signed)
Patient ID: David Lowe, male   DOB: 1990/05/19, 27 y.o.   MRN: [id number] D) Pt has been anxious, irritable, and agitated related to "withdrawal" s&s. Pt has been demanding regarding medication. Pt c/o headache, anxiety and racing thoughts. Pt negative for groups this morning though he did go outside and to the cafeteria. Pt focused on d/c as well if "can't get my medication straight". Pt denies s.i. A) level  3 obs for safety, support and reassurance provided. Med ed reinforced. Active listening employed. R) Labile, anxious.

## 2017-12-28 NOTE — Progress Notes (Addendum)
[facility] MD Progress Note  12/28/2017 1:15 PM David Lowe  MRN:  [id number] Subjective:    Patient states "I am really having a hard time. My is anxiety is very high. This protocol is not helping me. I want to leave here so I can go get some valium or xanax. I have been using xanax, valium, klonopin, and alcohol. I am feeling very agitated because of the withdrawal process."  Objective:   Per initial H & P by Priscille Loveless NP 12/27/2017:  David Lowe an 27 y.o.singlemalewho presents unaccompanied to [facility] reporting substance abuse and symptoms of anxiety and depression.Pt states he is seeking treatment at this time because "I'm just tired." He reports current suicidal ideation with no specific plan.Pt acknowledges symptoms including social withdrawal, loss of interest in usual pleasures, fatigue, irritability, decreased concentration, decreased sleep and feelings of guilt and hopelessness.He denies history of suicide attemptsbut Pt's medical record indicates he has recurring suicidal ideation. He reported homicidal ideation at triage but is denying homicidal thoughts during assessment. He denies history of aggression. He denies auditory or visual hallucinations.  Pt reports he is abusing alcohol, cocaine, Xanax and marijuana (see below for details of use). He states his longest period of sobriety is six months. Pt reports withdrawal symptoms when he doesn't have substances including tremors, nausea, vomiting, diarrhea and "feeling horrible." He denies history of seizures. Pt's blood alcohol level is 84 and urine drug screen is positive for cocaine.David Lowe an 27 y.o.singlemalewho presents unaccompanied to [facility] reporting substance abuse and symptoms of anxiety and depression.Pt states he is seeking treatment at this time because "I'm just tired." He reports current suicidal ideation with no specific plan.Pt acknowledges symptoms including social withdrawal, loss of interest in  usual pleasures, fatigue, irritability, decreased concentration, decreased sleep and feelings of guilt and hopelessness.He denies history of suicide attemptsbut Pt's medical record indicates he has recurring suicidal ideation. He reported homicidal ideation at triage but is denying homicidal thoughts during assessment. He denies history of aggression. He denies auditory or visual hallucinations.  Pt reports he is abusing alcohol, cocaine, Xanax and marijuana (see below for details of use). He states his longest period of sobriety is six months. Pt reports withdrawal symptoms when he doesn't have substances including tremors, nausea, vomiting, diarrhea and "feeling horrible." He denies history of seizures. Pt's blood alcohol level is 84 and urine drug screen is positive for cocaine.  Principal Problem: MDD (major depressive disorder), recurrent, severe, with psychosis ([place]) Diagnosis:   Patient Active Problem List   Diagnosis Date Noted  . Substance abuse ([place]) [F19.10] 12/27/2017  . MDD (major depressive disorder), recurrent, severe, with psychosis ([place]) [F33.3] 12/27/2017  . Alcohol dependence with alcohol-induced mood disorder ([place]) [F10.24]   . Benzodiazepine abuse ([place]) [F13.10]   . Unspecified depressive disorder [F32.9] 03/19/2016  . Cocaine use disorder, severe, dependence ([place]) [F14.20] 03/19/2016  . Opioid use disorder, moderate, dependence ([place]) [F11.20] 03/19/2016  . Cannabis use disorder, severe, dependence ([place]) [F12.20] 03/19/2016  . Tobacco use disorder [F17.200] 03/19/2016  . 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 Benzodiazepine withdrawal ([place]) [F13.239] 03/19/2016   Total Time spent with patient: 20 minutes  Past Psychiatric History: Polysubstance abuse  Past Medical History:  Past Medical History:  Diagnosis Date  . Anxiety attack   . MDD (major depressive disorder)    History reviewed. No pertinent surgical history. Family  History: History reviewed. No pertinent family history. Family Psychiatric  History: Unknown  Social History:  Social History   Substance and Sexual Activity  Alcohol Use Yes   Comment: last use today,      Social History   Substance and Sexual Activity  Drug Use Yes  . Types: Marijuana, Cocaine, Benzodiazepines   Comment: Takes xxanax - not prescribed, percocet     Social History   Socioeconomic History  . Marital status: Single    Spouse name: Not on file  . Number of children: Not on file  . Years of education: Not on file  . Highest education level: Not on file  Occupational History  . Not on file  Social Needs  . Financial resource strain: Not on file  . Food insecurity:    Worry: Not on file    Inability: Not on file  . Transportation needs:    Medical: Not on file    Non-medical: Not on file  Tobacco Use  . Smoking status: Current Every Day Smoker    Packs/day: 0.50    Types: Cigarettes  . Smokeless tobacco: Never Used  . Tobacco comment: declined  Substance and Sexual Activity  . Alcohol use: Yes    Comment: last use today,   . Drug use: Yes    Types: Marijuana, Cocaine, Benzodiazepines    Comment: Takes xxanax - not prescribed, percocet   . Sexual activity: Yes  Lifestyle  . Physical activity:    Days per week: Not on file    Minutes per session: Not on file  . Stress: Not on file  Relationships  . Social connections:    Talks on phone: Not on file    Gets together: Not on file    Attends religious service: Not on file    Active member of club or organization: Not on file    Attends meetings of clubs or organizations: Not on file    Relationship status: Not on file  Other Topics Concern  . Not on file  Social History Narrative  . Not on file   Additional Social History:                         Sleep: Fair  Appetite:  Fair  Current Medications: Current Facility-Administered Medications  Medication Dose Route Frequency Provider  Last Rate Last Dose  . acetaminophen (TYLENOL) tablet 650 mg  650 mg Oral Q6H PRN Cobos, Myer Peer, MD   650 mg at 12/28/17 0800  . alum & mag hydroxide-simeth (MAALOX/MYLANTA) 200-200-20 MG/5ML suspension 30 mL  30 mL Oral Q4H PRN Cobos, Fernando A, MD      . chlordiazePOXIDE (LIBRIUM) capsule 25 mg  25 mg Oral Q6H PRN Cobos, Myer Peer, MD   25 mg at 12/28/17 1023  . chlordiazePOXIDE (LIBRIUM) capsule 25 mg  25 mg Oral TID Cobos, Myer Peer, MD   25 mg at 12/28/17 1206   Followed by  . [START ON 12/29/2017] chlordiazePOXIDE (LIBRIUM) capsule 25 mg  25 mg Oral BH-qamhs Cobos, Myer Peer, MD       Followed by  . [START ON 12/30/2017] chlordiazePOXIDE (LIBRIUM) capsule 25 mg  25 mg Oral Daily Cobos, Fernando A, MD      . loperamide (IMODIUM) capsule 2-4 mg  2-4 mg Oral PRN Cobos, Myer Peer, MD      . magnesium hydroxide (MILK OF MAGNESIA) suspension 30 mL  30 mL Oral Daily PRN Cobos, Myer Peer, MD      . mirtazapine (REMERON) tablet 7.5 mg  7.5 mg Oral QHS Cobos, Myer Peer, MD   7.5 mg at 12/27/17 2059  . multivitamin with minerals tablet 1 tablet  1 tablet Oral Daily Cobos, Myer Peer, MD   1 tablet at 12/28/17 0854  . nicotine polacrilex (NICORETTE) gum 2 mg  2 mg Oral PRN Cobos, Myer Peer, MD      . OLANZapine (ZYPREXA) tablet 2.5 mg  2.5 mg Oral QHS Cobos, Myer Peer, MD   2.5 mg at 12/27/17 2059  . ondansetron (ZOFRAN-ODT) disintegrating tablet 4 mg  4 mg Oral Q6H PRN Cobos, Myer Peer, MD      . thiamine (B-1) injection 100 mg  100 mg Intramuscular Once Cobos, Fernando A, MD      . thiamine (VITAMIN B-1) tablet 100 mg  100 mg Oral Daily Cobos, Myer Peer, MD   100 mg at 12/28/17 6269    Lab Results:  Results for orders placed or performed during the hospital encounter of 12/26/17 (from the past 48 hour(s))  Urine rapid drug screen (hosp performed)     Status: Abnormal   Collection Time: 12/26/17 11:45 PM  Result Value Ref Range   Opiates NONE DETECTED NONE DETECTED   Cocaine  POSITIVE (A) NONE DETECTED   Benzodiazepines NONE DETECTED NONE DETECTED   Amphetamines NONE DETECTED NONE DETECTED   Tetrahydrocannabinol NONE DETECTED NONE DETECTED   Barbiturates NONE DETECTED NONE DETECTED    Comment: (NOTE) DRUG SCREEN FOR MEDICAL PURPOSES ONLY.  IF CONFIRMATION IS NEEDED FOR ANY PURPOSE, NOTIFY LAB WITHIN 5 DAYS. LOWEST DETECTABLE LIMITS FOR URINE DRUG SCREEN Drug Class                     Cutoff (ng/mL) Amphetamine and metabolites    1000 Barbiturate and metabolites    200 Benzodiazepine                 485 Tricyclics and metabolites     300 Opiates and metabolites        300 Cocaine and metabolites        300 THC                            50 Performed at [facility], [address] [place], [place] [address]   Comprehensive metabolic panel     Status: Abnormal   Collection Time: 12/26/17 11:47 PM  Result Value Ref Range   Sodium 139 135 - 145 mmol/L   Potassium 3.5 3.5 - 5.1 mmol/L   Chloride 108 98 - 111 mmol/L   CO2 23 22 - 32 mmol/L   Glucose, Bld 119 (H) 70 - 99 mg/dL   BUN 17 6 - 20 mg/dL   Creatinine, Ser 0.85 0.61 - 1.24 mg/dL   Calcium 9.0 8.9 - 10.3 mg/dL   Total Protein 7.2 6.5 - 8.1 g/dL   Albumin 4.4 3.5 - 5.0 g/dL   AST 14 (L) 15 - 41 U/L   ALT 11 0 - 44 U/L   Alkaline Phosphatase 58 38 - 126 U/L   Total Bilirubin 0.4 0.3 - 1.2 mg/dL   GFR calc non Af Amer >60 >60 mL/min   GFR calc Af Amer >60 >60 mL/min    Comment: (NOTE) The eGFR has been calculated using the CKD EPI equation. This calculation has not been validated in all clinical situations. eGFR's persistently <60 mL/min signify possible Chronic Kidney Disease.    Anion gap 8 5 -  15    Comment: Performed at [facility], [address] [place], [place] [address]  Ethanol     Status: Abnormal   Collection Time: 12/26/17 11:47 PM  Result Value Ref Range   Alcohol, Ethyl (B) 84 (H) <10 mg/dL    Comment: (NOTE) Lowest detectable limit for serum alcohol is 10 mg/dL. For  medical purposes only. Performed at [facility], [address] [place], [place] [address]   CBC with Diff     Status: Abnormal   Collection Time: 12/26/17 11:47 PM  Result Value Ref Range   WBC 14.6 (H) 4.0 - 10.5 K/uL   RBC 4.58 4.22 - 5.81 MIL/uL   Hemoglobin 15.0 13.0 - 17.0 g/dL   HCT 42.9 39.0 - 52.0 %   MCV 93.7 78.0 - 100.0 fL   MCH 32.8 26.0 - 34.0 pg   MCHC 35.0 30.0 - 36.0 g/dL   RDW 12.8 11.5 - 15.5 %   Platelets 187 150 - 400 K/uL   Neutrophils Relative % 79 %   Neutro Abs 11.6 (H) 1.7 - 7.7 K/uL   Lymphocytes Relative 16 %   Lymphs Abs 2.3 0.7 - 4.0 K/uL   Monocytes Relative 5 %   Monocytes Absolute 0.7 0.1 - 1.0 K/uL   Eosinophils Relative 0 %   Eosinophils Absolute 0.1 0.0 - 0.7 K/uL   Basophils Relative 0 %   Basophils Absolute 0.0 0.0 - 0.1 K/uL    Comment: Performed at [facility], [address], [place], [place] [address]    Blood Alcohol level:  Lab Results  Component Value Date   ETH 84 (H) 12/26/2017   ETH <5 56/43/3295    Metabolic Disorder Labs: Lab Results  Component Value Date   HGBA1C 5.0 03/19/2016   MPG 97 03/19/2016   Lab Results  Component Value Date   PROLACTIN 12.3 03/19/2016   Lab Results  Component Value Date   CHOL 152 03/19/2016   TRIG 83 03/19/2016   HDL 43 03/19/2016   CHOLHDL 3.5 03/19/2016   VLDL 17 03/19/2016   LDLCALC 92 03/19/2016    Physical Findings: AIMS: Facial and Oral Movements Muscles of Facial Expression: None, normal Lips and Perioral Area: None, normal Jaw: None, normal Tongue: None, normal,Extremity Movements Upper (arms, wrists, hands, fingers): None, normal Lower (legs, knees, ankles, toes): None, normal, Trunk Movements Neck, shoulders, hips: None, normal, Overall Severity Severity of abnormal movements (highest score from questions above): None, normal Incapacitation due to abnormal movements: None, normal Patient's awareness of abnormal movements (rate only patient's report): No Awareness,  Dental Status Current problems with teeth and/or dentures?: No Does patient usually wear dentures?: No  CIWA:  CIWA-Ar Total: 3 COWS:     Musculoskeletal: Strength & Muscle Tone: within normal limits Gait & Station: normal Patient leans: N/A  Psychiatric Specialty Exam: Physical Exam  ROS  Blood pressure 123/83, pulse 65, temperature 98 F (36.7 C), temperature source Oral, resp. rate 16, height 5' 10"  (1.778 m), weight 66.7 kg.Body mass index is 21.09 kg/m.  General Appearance: Casual  Eye Contact:  Good  Speech:  Clear and Coherent  Volume:  Increased  Mood:  Irritable  Affect:  Labile  Thought Process:  Coherent  Orientation:  Full (Time, Place, and Person)  Thought Content:  Logical  Suicidal Thoughts:  Yes.  without intent/plan  Homicidal Thoughts:  No  Memory:  Immediate;   Good Recent;   Good Remote;   Good  Judgement:  Poor  Insight:  Shallow  Psychomotor Activity:  Increased and Restlessness  Concentration:  Concentration: Fair and Attention Span: Fair  Recall:  [employer] of Knowledge:  Fair  Language:  Good  Akathisia:  No  Handed:  Right  AIMS (if indicated):     Assets:  Communication Skills Desire for Improvement Leisure Time Physical Health Resilience Social Support  ADL's:  Intact  Cognition:  WNL  Sleep:  Number of Hours: 1.5     Treatment Plan Summary: Daily contact with patient to assess and evaluate symptoms and progress in treatment and Medication management   -Continue librium detox protocol as ordered for symptoms of benzo/alcohol withdrawal -Continue Remeron 7.5 mg 1 po hs for depression/anxiety -Increase Zyprexa to 5 mg po hs for mood control/agitation  DAVIS, LAURA, NP 12/28/2017, 1:15 PM   ..Agree with NP Progress Note

## 2017-12-28 NOTE — Progress Notes (Signed)
Recreation Therapy Notes  Date: 9.9.19 Time: 0930 Location: 300 [address] Dayroom  Group Topic: Stress Management  Goal Area(s) Addresses:  Patient will verbalize importance of using healthy stress management.  Patient will identify positive emotions associated with healthy stress management.   Intervention: Stress Management  Activity :  [employer].  LRT introduced the stress management technique of meditation.  Patients were to listen as meditation played to engage in the activity.  Education:  Stress Management, Discharge Planning.   Education Outcome: Acknowledges edcuation/In group clarification offered/Needs additional education  Clinical Observations/Feedback: Pt did not attend group.    Caroll Rancher, LRT/CTRS      Caroll Rancher A 12/28/2017 12:04 PM

## 2017-12-28 NOTE — Progress Notes (Signed)
Patient ID: David Lowe, male   DOB: August 12, 1990, 27 y.o.   MRN: [id number] Pt has decreased anxiety and agitation this evening. [name] asked nurse to hold Librium until after supper as he stated he was feeling better having earlier doses.

## 2017-12-28 NOTE — Tx Team (Signed)
Interdisciplinary Treatment and Diagnostic Plan Update  12/28/2017 Time of Session: 10:10am David Lowe MRN: [id number]  Principal Diagnosis: MDD (major depressive disorder), recurrent, severe, with psychosis ([place])  Secondary Diagnoses: Principal Problem:   MDD (major depressive disorder), recurrent, severe, with psychosis ([place]) Active Problems:   Substance abuse ([place])   Alcohol dependence with alcohol-induced mood disorder ([place])   Benzodiazepine abuse ([place])   Current Medications:  Current Facility-Administered Medications  Medication Dose Route Frequency Provider Last Rate Last Dose  . acetaminophen (TYLENOL) tablet 650 mg  650 mg Oral Q6H PRN Cobos, Myer Peer, MD      . alum & mag hydroxide-simeth (MAALOX/MYLANTA) 200-200-20 MG/5ML suspension 30 mL  30 mL Oral Q4H PRN Cobos, Myer Peer, MD      . chlordiazePOXIDE (LIBRIUM) capsule 25 mg  25 mg Oral Q6H PRN Cobos, Fernando A, MD      . chlordiazePOXIDE (LIBRIUM) capsule 25 mg  25 mg Oral TID Cobos, Myer Peer, MD       Followed by  . [START ON 12/29/2017] chlordiazePOXIDE (LIBRIUM) capsule 25 mg  25 mg Oral BH-qamhs Cobos, Myer Peer, MD       Followed by  . [START ON 12/30/2017] chlordiazePOXIDE (LIBRIUM) capsule 25 mg  25 mg Oral Daily Cobos, Fernando A, MD      . loperamide (IMODIUM) capsule 2-4 mg  2-4 mg Oral PRN Cobos, Myer Peer, MD      . magnesium hydroxide (MILK OF MAGNESIA) suspension 30 mL  30 mL Oral Daily PRN Cobos, Fernando A, MD      . mirtazapine (REMERON) tablet 7.5 mg  7.5 mg Oral QHS Cobos, Myer Peer, MD   7.5 mg at 12/27/17 2059  . multivitamin with minerals tablet 1 tablet  1 tablet Oral Daily Cobos, Fernando A, MD      . nicotine polacrilex (NICORETTE) gum 2 mg  2 mg Oral PRN Cobos, Myer Peer, MD      . OLANZapine (ZYPREXA) tablet 2.5 mg  2.5 mg Oral QHS Cobos, Myer Peer, MD   2.5 mg at 12/27/17 2059  . ondansetron (ZOFRAN-ODT) disintegrating tablet 4 mg  4 mg Oral Q6H PRN Cobos, Myer Peer, MD      . thiamine  (B-1) injection 100 mg  100 mg Intramuscular Once Cobos, Fernando A, MD      . thiamine (VITAMIN B-1) tablet 100 mg  100 mg Oral Daily Cobos, Myer Peer, MD       PTA Medications: Medications Prior to Admission  Medication Sig Dispense Refill Last Dose  . acetaminophen (TYLENOL) 325 MG tablet Take 650 mg by mouth every 6 (six) hours as needed for mild pain or headache.     . citalopram (CELEXA) 20 MG tablet Take 1 tablet (20 mg total) by mouth daily. 30 tablet 0   . ibuprofen (ADVIL,MOTRIN) 200 MG tablet Take 400 mg by mouth every 6 (six) hours as needed for headache or mild pain.     . mirtazapine (REMERON) 15 MG tablet Take 1 tablet (15 mg total) by mouth at bedtime. 30 tablet 0     Patient Stressors: Financial difficulties Substance abuse  Patient Strengths: Active sense of humor Average or above average intelligence Communication skills Motivation for treatment/growth Supportive family/friends  Treatment Modalities: Medication Management, Group therapy, Case management,  1 to 1 session with clinician, Psychoeducation, Recreational therapy.   Physician Treatment Plan for Primary Diagnosis: MDD (major depressive disorder), recurrent, severe, with psychosis ([place]) Long Term Goal(s): Improvement in symptoms so  as ready for discharge Improvement in symptoms so as ready for discharge   Short Term Goals: Ability to identify changes in lifestyle to reduce recurrence of condition will improve Ability to verbalize feelings will improve Ability to disclose and discuss suicidal ideas Ability to demonstrate self-control will improve Ability to identify and develop effective coping behaviors will improve Ability to maintain clinical measurements within normal limits will improve Compliance with prescribed medications will improve  Medication Management: Evaluate patient's response, side effects, and tolerance of medication regimen.  Therapeutic Interventions: 1 to 1 sessions, Unit Group  sessions and Medication administration.  Evaluation of Outcomes: Not Met  Physician Treatment Plan for Secondary Diagnosis: Principal Problem:   MDD (major depressive disorder), recurrent, severe, with psychosis ([place]) Active Problems:   Substance abuse ([place])   Alcohol dependence with alcohol-induced mood disorder ([place])   Benzodiazepine abuse ([place])  Long Term Goal(s): Improvement in symptoms so as ready for discharge Improvement in symptoms so as ready for discharge   Short Term Goals: Ability to identify changes in lifestyle to reduce recurrence of condition will improve Ability to verbalize feelings will improve Ability to disclose and discuss suicidal ideas Ability to demonstrate self-control will improve Ability to identify and develop effective coping behaviors will improve Ability to maintain clinical measurements within normal limits will improve Compliance with prescribed medications will improve     Medication Management: Evaluate patient's response, side effects, and tolerance of medication regimen.  Therapeutic Interventions: 1 to 1 sessions, Unit Group sessions and Medication administration.  Evaluation of Outcomes: Not Met   RN Treatment Plan for Primary Diagnosis: MDD (major depressive disorder), recurrent, severe, with psychosis ([place]) Long Term Goal(s): Knowledge of disease and therapeutic regimen to maintain health will improve  Short Term Goals: Ability to demonstrate self-control, Ability to participate in decision making will improve, Ability to disclose and discuss suicidal ideas, Ability to identify and develop effective coping behaviors will improve and Compliance with prescribed medications will improve  Medication Management: RN will administer medications as ordered by provider, will assess and evaluate patient's response and provide education to patient for prescribed medication. RN will report any adverse and/or side effects to prescribing  provider.  Therapeutic Interventions: 1 on 1 counseling sessions, Psychoeducation, Medication administration, Evaluate responses to treatment, Monitor vital signs and CBGs as ordered, Perform/monitor CIWA, COWS, AIMS and Fall Risk screenings as ordered, Perform wound care treatments as ordered.  Evaluation of Outcomes: Not Met   LCSW Treatment Plan for Primary Diagnosis: MDD (major depressive disorder), recurrent, severe, with psychosis ([place]) Long Term Goal(s): Safe transition to appropriate next level of care at discharge, Engage patient in therapeutic group addressing interpersonal concerns.  Short Term Goals: Engage patient in aftercare planning with referrals and resources and Increase skills for wellness and recovery  Therapeutic Interventions: Assess for all discharge needs, 1 to 1 time with Social worker, Explore available resources and support systems, Assess for adequacy in community support network, Educate family and significant other(s) on suicide prevention, Complete Psychosocial Assessment, Interpersonal group therapy.  Evaluation of Outcomes: Not Met   Progress in Treatment: Attending groups: No. Participating in groups: No. Taking medication as prescribed: Yes. Toleration medication: Yes. Family/Significant other contact made: No, will contact:  patient refused consent for contact Patient understands diagnosis: Yes. Discussing patient identified problems/goals with staff: Yes. Medical problems stabilized or resolved: Yes. Denies suicidal/homicidal ideation: Yes. Issues/concerns per patient self-inventory: No. Other:   New problem(s) identified: None   New Short Term/Long Term Goal(s): Detox, medication  stabilization, elimination of SI thoughts, development of comprehensive mental wellness plan.   Patient Goals:  Get clean  Discharge Plan or Barriers: CSW will continue to assess for appropriate referrals and discharge planning.   Reason for Continuation of  Hospitalization: Anxiety Depression Medication stabilization Suicidal ideation Withdrawal symptoms  Estimated Length of Stay:3-5 days   Attendees: Patient: 12/28/2017 8:47 AM  Physician: Dr. Maris Berger, MD 12/28/2017 8:47 AM  Nursing: Selinda Eon RN  12/28/2017 8:47 AM  RN Care Manager: Rhunette Croft 12/28/2017 8:47 AM  Social Worker: Radonna Ricker, Latanya Presser 12/28/2017 8:47 AM  Recreational Therapist: Rhunette Croft 12/28/2017 8:47 AM  Other: Marvia Pickles, NP; Elmarie Shiley, NP  12/28/2017 8:47 AM  Other:  12/28/2017 8:47 AM  Other: 12/28/2017 8:47 AM    Scribe for Treatment Team: Marylee Floras, [place] 12/28/2017 8:47 AM

## 2017-12-28 NOTE — Progress Notes (Signed)
[facility] Group Notes:  (Nursing/MHT/Case Management/Adjunct)  Date:  12/28/2017  Time:  2045  Type of Therapy:  wrap up group  Participation Level:  Active  Participation Quality:  Appropriate, Attentive, Sharing and Supportive  Affect:  Appropriate  Cognitive:  Appropriate  Insight:  Improving  Engagement in Group:  Engaged  Modes of Intervention:  Clarification, Education and Support  Summary of Progress/Problems: Pt shared that he feels it is a good thing he is here although he was yelling about being discharged in the hallway right before group time. Pt shares that he wouldn't change a thing because everything good and bad happens for a reason. Pt is grateful for his family.   Marcille Buffy 12/28/2017, 10:29 PM

## 2017-12-29 LAB — CBC WITH DIFFERENTIAL/PLATELET
BASOS PCT: 0 %
Basophils Absolute: 0 10*3/uL (ref 0.0–0.1)
EOS ABS: 0.2 10*3/uL (ref 0.0–0.7)
EOS PCT: 2 %
HCT: 45.7 % (ref 39.0–52.0)
Hemoglobin: 15.9 g/dL (ref 13.0–17.0)
LYMPHS ABS: 2.3 10*3/uL (ref 0.7–4.0)
Lymphocytes Relative: 30 %
MCH: 32.5 pg (ref 26.0–34.0)
MCHC: 34.8 g/dL (ref 30.0–36.0)
MCV: 93.5 fL (ref 78.0–100.0)
MONOS PCT: 6 %
Monocytes Absolute: 0.4 10*3/uL (ref 0.1–1.0)
Neutro Abs: 4.7 10*3/uL (ref 1.7–7.7)
Neutrophils Relative %: 62 %
PLATELETS: 222 10*3/uL (ref 150–400)
RBC: 4.89 MIL/uL (ref 4.22–5.81)
RDW: 12.7 % (ref 11.5–15.5)
WBC: 7.6 10*3/uL (ref 4.0–10.5)

## 2017-12-29 MED ORDER — OLANZAPINE 10 MG PO TABS
10.0000 mg | ORAL_TABLET | Freq: Every day | ORAL | Status: DC
  Administered 2017-12-29: 10 mg via ORAL
  Filled 2017-12-29 (×3): qty 1

## 2017-12-29 NOTE — Progress Notes (Signed)
D:  David Lowe was very agitated at the beginning of shift.  Yelling and cursing towards visitors and staff.  He was demanding to leave and yelling that "they aren't giving me meds to help with my withdrawal."  He was difficult to redirect but finally went to his room.  He was able to be deescalated after discussing the 72 hour request for discharge (which he signed earlier in the day) and the librium protocol.  Urged him to take his medications as prescribed to help the withdrawal symptoms.  PRN for withdrawal given and he requested to have vistaril ordered to help with anxiety.  Staff with Barbara Cower NP, vistaril was ordered and dose give at hs.  He denied SI/HI or A/V hallucinations.  He is currently resting with his eyes closed and appears to be asleep. A:  1:1 with RN for support and encouragement.  Medications as ordered.  Q 15 minute checks maintained for safety.  Encouraged participation in group and unit activities.   R:  T[place] remains safe on the unit.  We will continue to monitor the progress towards his goals.

## 2017-12-29 NOTE — Plan of Care (Signed)
  Problem: Activity: Goal: Interest or engagement in activities will improve Outcome: Progressing   Problem: Coping: Goal: Ability to verbalize frustrations and anger appropriately will improve Outcome: Progressing   Problem: Safety: Goal: Periods of time without injury will increase Outcome: Progressing   

## 2017-12-29 NOTE — BHH Group Notes (Signed)
[facility] Mental Health Association Group Therapy 12/29/2017 1:15pm  Type of Therapy: Mental Health Association Presentation  Participation Level: Invited. Chose to remain in bed.   Rona Ravens, LCSW 12/29/2017 1:50 PM

## 2017-12-29 NOTE — Progress Notes (Signed)
Adult Psychoeducational Group Note  Date:  12/29/2017 Time: 1600  Group Topic/Focus:  Coping With Mental Health Crisis:   The purpose of this group is to help patients identify strategies for coping with mental health crisis.  Group discusses possible causes of crisis and ways to manage them effectively.  Participation Level:  Active  Participation Quality:  Appropriate  Affect:  Appropriate  Cognitive:  Appropriate  Insight: Appropriate  Engagement in Group:  Engaged  Modes of Intervention:  Discussion and Education  Additional Comments:      

## 2017-12-29 NOTE — Progress Notes (Addendum)
[facility] MD Progress Note  12/29/2017 10:53 AM David Lowe  MRN:  [id number] Subjective: Patient is seen and examined.  Patient is a 27 year old male admitted on 9/8 secondary to abusing Xanax and drinking alcohol.  He is also been using cocaine.  He remains hyper religious.  He stated he sees people in the world everywhere he goes.  He is unsure how to deal with this.  This is consistent with his admission H&P.  His obsessive thinking about this is almost psychotic.  He was started on Zyprexa.  We discussed possibly increasing that.  He is psychomotor agitated.  His vital signs are stable, he is afebrile.  He remains on a Librium taper.  He also continues on mirtazapine 7.5 mg p.o. nightly, and had olanzapine 5 mg p.o. nightly last night.  He slept 5-1/2 hours.  He denied any suicidal ideation. Principal Problem: MDD (major depressive disorder), recurrent, severe, with psychosis (HCC) Diagnosis:   Patient Active Problem List   Diagnosis Date Noted  . Substance abuse (HCC) [F19.10] 12/27/2017  . MDD (major depressive disorder), recurrent, severe, with psychosis (HCC) [F33.3] 12/27/2017  . Alcohol dependence with alcohol-induced mood disorder (HCC) [F10.24]   . 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 Unspecified depressive disorder [F32.9] 03/19/2016  . Cocaine use disorder, severe, dependence (HCC) [F14.20] 03/19/2016  . Opioid use disorder, moderate, dependence (HCC) [F11.20] 03/19/2016  . Cannabis use disorder, severe, dependence (HCC) [F12.20] 03/19/2016  . Tobacco use disorder [F17.200] 03/19/2016  . 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 Benzodiazepine withdrawal (HCC) [F13.239] 03/19/2016   Total Time spent with patient: 20 minutes  Past Psychiatric History: See admission H&P  Past Medical History:  Past Medical History:  Diagnosis Date  . Anxiety attack   . MDD (major depressive disorder)    History reviewed. No pertinent surgical history. Family  History: History reviewed. No pertinent family history. Family Psychiatric  History: See admission H&P Social History:  Social History   Substance and Sexual Activity  Alcohol Use Yes   Comment: last use today,      Social History   Substance and Sexual Activity  Drug Use Yes  . Types: Marijuana, Cocaine, Benzodiazepines   Comment: Takes xxanax - not prescribed, percocet     Social History   Socioeconomic History  . Marital status: Single    Spouse name: Not on file  . Number of children: Not on file  . Years of education: Not on file  . Highest education level: Not on file  Occupational History  . Not on file  Social Needs  . Financial resource strain: Not on file  . Food insecurity:    Worry: Not on file    Inability: Not on file  . Transportation needs:    Medical: Not on file    Non-medical: Not on file  Tobacco Use  . Smoking status: Current Every Day Smoker    Packs/day: 0.50    Types: Cigarettes  . Smokeless tobacco: Never Used  . Tobacco comment: declined  Substance and Sexual Activity  . Alcohol use: Yes    Comment: last use today,   . Drug use: Yes    Types: Marijuana, Cocaine, Benzodiazepines    Comment: Takes xxanax - not prescribed, percocet   . Sexual activity: Yes  Lifestyle  . Physical activity:    Days per week: Not on file    Minutes per session: Not on file  . Stress: Not on file  Relationships  .  Social connections:    Talks on phone: Not on file    Gets together: Not on file    Attends religious service: Not on file    Active member of club or organization: Not on file    Attends meetings of clubs or organizations: Not on file    Relationship status: Not on file  Other Topics Concern  . Not on file  Social History Narrative  . Not on file   Additional Social History:                         Sleep: Fair  Appetite:  Fair  Current Medications: Current Facility-Administered Medications  Medication Dose Route  Frequency Provider Last Rate Last Dose  . acetaminophen (TYLENOL) tablet 650 mg  650 mg Oral Q6H PRN Cobos, Rockey Situ, MD   650 mg at 12/28/17 0800  . alum & mag hydroxide-simeth (MAALOX/MYLANTA) 200-200-20 MG/5ML suspension 30 mL  30 mL Oral Q4H PRN Cobos, Fernando A, MD      . chlordiazePOXIDE (LIBRIUM) capsule 25 mg  25 mg Oral Q6H PRN Cobos, Rockey Situ, MD   25 mg at 12/28/17 2017  . chlordiazePOXIDE (LIBRIUM) capsule 25 mg  25 mg Oral BH-qamhs Cobos, Rockey Situ, MD   25 mg at 12/29/17 0811   Followed by  . [START ON 12/30/2017] chlordiazePOXIDE (LIBRIUM) capsule 25 mg  25 mg Oral Daily Cobos, Fernando A, MD      . hydrOXYzine (ATARAX/VISTARIL) tablet 25 mg  25 mg Oral TID PRN Nira Conn A, NP   25 mg at 12/29/17 0952  . loperamide (IMODIUM) capsule 2-4 mg  2-4 mg Oral PRN Cobos, Rockey Situ, MD      . magnesium hydroxide (MILK OF MAGNESIA) suspension 30 mL  30 mL Oral Daily PRN Cobos, Rockey Situ, MD      . mirtazapine (REMERON) tablet 7.5 mg  7.5 mg Oral QHS Cobos, Rockey Situ, MD   7.5 mg at 12/28/17 2122  . multivitamin with minerals tablet 1 tablet  1 tablet Oral Daily Cobos, Rockey Situ, MD   1 tablet at 12/29/17 0811  . nicotine polacrilex (NICORETTE) gum 2 mg  2 mg Oral PRN Cobos, Rockey Situ, MD   2 mg at 12/29/17 1042  . OLANZapine (ZYPREXA) tablet 10 mg  10 mg Oral QHS Antonieta Pert, MD      . ondansetron (ZOFRAN-ODT) disintegrating tablet 4 mg  4 mg Oral Q6H PRN Cobos, Rockey Situ, MD      . thiamine (B-1) injection 100 mg  100 mg Intramuscular Once Cobos, Fernando A, MD      . thiamine (VITAMIN B-1) tablet 100 mg  100 mg Oral Daily Cobos, Rockey Situ, MD   100 mg at 12/29/17 9629    Lab Results: No results found for this or any previous visit (from the past 48 hour(s)).  Blood Alcohol level:  Lab Results  Component Value Date   ETH 84 (H) 12/26/2017   ETH <5 03/17/2016    Metabolic Disorder Labs: Lab Results  Component Value Date   HGBA1C 5.0 03/19/2016   MPG 97  03/19/2016   Lab Results  Component Value Date   PROLACTIN 12.3 03/19/2016   Lab Results  Component Value Date   CHOL 152 03/19/2016   TRIG 83 03/19/2016   HDL 43 03/19/2016   CHOLHDL 3.5 03/19/2016   VLDL 17 03/19/2016   LDLCALC 92 03/19/2016    Physical Findings: AIMS:  Facial and Oral Movements Muscles of Facial Expression: None, normal Lips and Perioral Area: None, normal Jaw: None, normal Tongue: None, normal,Extremity Movements Upper (arms, wrists, hands, fingers): None, normal Lower (legs, knees, ankles, toes): None, normal, Trunk Movements Neck, shoulders, hips: None, normal, Overall Severity Severity of abnormal movements (highest score from questions above): None, normal Incapacitation due to abnormal movements: None, normal Patient's awareness of abnormal movements (rate only patient's report): No Awareness, Dental Status Current problems with teeth and/or dentures?: No Does patient usually wear dentures?: No  CIWA:  CIWA-Ar Total: 3 COWS:     Musculoskeletal: Strength & Muscle Tone: within normal limits Gait & Station: normal Patient leans: N/A  Psychiatric Specialty Exam: Physical Exam  Nursing note and vitals reviewed. Constitutional: He is oriented to person, place, and time. He appears well-developed and well-nourished.  HENT:  Head: Atraumatic.  Respiratory: Effort normal.  Neurological: He is alert and oriented to person, place, and time.    ROS  Blood pressure 123/80, pulse 66, temperature (!) 97.5 F (36.4 C), temperature source Oral, resp. rate 16, height 5\' 10"  (1.778 m), weight 66.7 kg.Body mass index is 21.09 kg/m.  General Appearance: Disheveled  Eye Contact:  Fair  Speech:  Pressured  Volume:  Increased  Mood:  Anxious and Agitated  Affect:  Congruent  Thought Process:  Coherent and Descriptions of Associations: Loose  Orientation:  Full (Time, Place, and Person)  Thought Content:  Delusions  Suicidal Thoughts:  No  Homicidal  Thoughts:  No  Memory:  Immediate;   Fair Recent;   Fair Remote;   Fair  Judgement:  Impaired  Insight:  Lacking  Psychomotor Activity:  Increased  Concentration:  Concentration: Fair and Attention Span: Fair  Recall:  [employer] of Knowledge:  Fair  Language:  Fair  Akathisia:  Negative  Handed:  Right  AIMS (if indicated):     Assets:  Desire for Improvement Housing Leisure Time Physical Health Resilience  ADL's:  Intact  Cognition:  WNL  Sleep:  Number of Hours: 530     Treatment Plan Summary: Daily contact with patient to assess and evaluate symptoms and progress in treatment, Medication management and Plan : Patient is seen and examined.  Patient is a 27 year old male with the above-stated past psychiatric history seen in follow-up1. psychosis-admission diagnosis was major depression with psychotic features.  I do not know if this is related to his substance withdrawal, his substance abuse, and underlying mental illness.  I am going to increase his Zyprexa to 10 mg p.o. nightly2. alcohol dependence/benzodiazepine dependence-continue Librium taper/detox3. substance dependency-patient has at least voiced no interest in substance rehabilitation programs at this time.  He stated he was going to return home to his grandparents home.  Hopefully we can get his psychosis and withdrawal symptoms under control. #4 depression-continue mirtazapine 7.5 mg p.o. nightly. Antonieta Pert, MD 12/29/2017, 10:53 AM

## 2017-12-29 NOTE — Progress Notes (Signed)
DAR NOTE: Patient presents with anxious affect and labile mood. Pt stated he was to go home today, but went off on the doctor. Pt complained of not getting xanax, stated he was first prescribed xanax when he was 27 yrs old, and since then, he been addicted to it. Pt is blaming the doctor who prescribed it, does not understand why the doctor took him it after getting him hooked to it. Pt since then he has been buying xanax of the street.  He repots fair sleep, good appetite, normal energy, and good concentration. Denies pain, auditory and visual hallucinations.  Rates depression at 0, hopelessness at 0, and anxiety at 5.  Maintained on routine safety checks.  Medications given as prescribed.  Support and encouragement offered as needed.  States goal for today is " going home." Will continue to monitor.

## 2017-12-29 NOTE — Plan of Care (Signed)
  Problem: Coping: Goal: Ability to verbalize frustrations and anger appropriately will improve Outcome: Not Progressing-David Lowe was note yelling and demanding to leave.  He continued to be agitated but was finally able to be deescalated and later apologized for his actions.

## 2017-12-30 ENCOUNTER — Encounter (HOSPITAL_COMMUNITY): Payer: Self-pay | Admitting: Registered Nurse

## 2017-12-30 MED ORDER — OLANZAPINE 10 MG PO TABS: 10.0000 mg | ORAL_TABLET | Freq: Every day | ORAL | 0 refills | Status: DC

## 2017-12-30 MED ORDER — MIRTAZAPINE 7.5 MG PO TABS: 7.5000 mg | ORAL_TABLET | Freq: Every day | ORAL | 0 refills | Status: DC

## 2017-12-30 MED ORDER — HYDROXYZINE HCL 25 MG PO TABS: 25.0000 mg | ORAL_TABLET | Freq: Three times a day (TID) | ORAL | 0 refills | Status: DC | PRN

## 2017-12-30 NOTE — Progress Notes (Signed)
Recreation Therapy Notes  Date: 9.11.19 Time: 0930 Location: 300 [address] Dayroom  Group Topic: Stress Management  Goal Area(s) Addresses:  Patient will verbalize importance of using healthy stress management.  Patient will identify positive emotions associated with healthy stress management.   Behavioral Response: Engaged  Intervention: Stress Management  Activity :  Guided Imagery.  LRT introduced the stress management technique of guided imagery.  LRT read a script for patients to envision seeing a starry sky at night.  Patients were to follow along as script was read to engage in activity.  Education:  Stress Management, Discharge Planning.   Education Outcome: Acknowledges edcuation/In group clarification offered/Needs additional education  Clinical Observations/Feedback: Pt attended and participated in group.    Caroll Rancher, LRT/CTRS         Caroll Rancher A 12/30/2017 11:56 AM

## 2017-12-30 NOTE — Therapy (Signed)
Occupational Therapy Group Note  Date:  12/30/2017 Time:  1:05 PM  Group Topic/Focus:  Self Esteem Action Plan:   The focus of this group is to help patients create a plan to continue to build self-esteem after discharge.  Participation Level:  Active  Participation Quality:  Appropriate  Affect:  Jovial  Cognitive:  Appropriate  Insight: Lacking  Engagement in Group:  Engaged  Modes of Intervention:  Activity, Discussion, Education and Socialization  Additional Comments:    S: "My family won't accept me knowing I am on drugs"  O:Education given on self esteem, its definition, and how it becomes negative vs positive. Self esteem education given on its relation to MH. Pt encouraged to contribute in discussion and brainstorm. Self esteem activity completed where pt is to name a positive word for each letter of the alphabet (A-Z). Pt then to complete coat of arms activity to encompass roles, values, goals, and favorite qualities. Coloring encouraged within this activity. Positive affirmations worksheet given at end of session for pt to practice and continue building this skill.  A: Pt presents to group with jovial affect, large smiles throughout session. Pt engaged and participatory throughout session, occasionally off topic but easily redirected and aware when interrupting others- would apologize. Pt contributed to discussion of positive vs negative aspects of self esteem. Pt completed A-Z activity with moderate VC's to choose positive descriptors, eager to share with others. Pt leaving at end of session, mildly disorganized in thought, leaving to go "lie down"  P: Handouts given to facilitate carryover into community.  Dalphine Handing, MSOT, OTR/L  [place] 12/30/2017, 1:05 PM

## 2017-12-30 NOTE — Plan of Care (Signed)
  Problem: Education: Goal: Emotional status will improve Outcome: Progressing Note:  He no longer reports suicidal ideation and stated today was better than yesterday.   Problem: Activity: Goal: Sleeping patterns will improve Outcome: Progressing Note:  He reported sleeping well and is currently sleeping.

## 2017-12-30 NOTE — Progress Notes (Signed)
Discharge note: Patient reviewed discharge paperwork with RN including prescriptions, follow up appointments, and lab work. Patient given the opportunity to ask questions. All concerns were addressed. All belongings were returned to patient. Denied SI/HI/AVH. Patient thanked staff for their care while at the hospital.  Patient was discharged to lobby where his grandfather was waiting to pick him up.

## 2017-12-30 NOTE — Progress Notes (Signed)
  [facility] Adult Case Management Discharge Plan :  Will you be returning to the same living situation after discharge:  Yes,  patient reports he is returing home with his mother At discharge, do you have transportation home?: Yes,  patient reports his grandfather is picking him up at discharge Do you have the ability to pay for your medications: No.  Release of information consent forms completed and in the chart;  Patient's signature needed at discharge.  Patient to Follow up at: Follow-up Information    [facility] Go on 01/06/2018.   Why:  Hospital follow up is Wednesday, 01/06/18 at 10:00am. Be sure to bring your Photo ID, SSN, insurane information and any discharge paperwork from this hospitalization.  Contact information: [address] [place] [address] [phone]           Next level of care provider has access to [facility] Link:yes  Safety Planning and Suicide Prevention discussed: Yes,  with the patient  Have you used any form of tobacco in the last 30 days? (Cigarettes, Smokeless Tobacco, Cigars, and/or Pipes): Yes  Has patient been referred to the Quitline?: Patient refused referral  Patient has been referred for addiction treatment: Pt. refused referral  Maeola Sarah, LCSWA 12/30/2017, 10:16 AM

## 2017-12-30 NOTE — Progress Notes (Signed)
D:  David Lowe was up and visible in the day room.  He was interacting well with staff and peers this evening.  He was pleasant and cooperative.  He apologized for his behavior last night.  He denied any withdrawal symptoms this evening.  He took his HS medications without difficulty.  He denied any pain or discomfort and appeared to be in no physical distress.  He denied SI/HI or A/V hallucinations.  He stated that he was talking about his religious beliefs earlier today and said "I am not crazy."  Reviewed medications and he voiced no questions or issues at this time.   A:  1:1 with RN for support and encouragement.  Medications as ordered.  Q 15 minute checks maintained for safety.  Encouraged participation in group and unit activities.   R:  Lyden remains safe on the unit.  We will continue to monitor the progress towards his goals.

## 2017-12-30 NOTE — BHH Suicide Risk Assessment (Signed)
[facility] Discharge Suicide Risk Assessment   Principal Problem: MDD (major depressive disorder), recurrent, severe, with psychosis (HCC) Discharge Diagnoses:  Patient Active Problem List   Diagnosis Date Noted  . Substance abuse (HCC) [F19.10] 12/27/2017  . MDD (major depressive disorder), recurrent, severe, with psychosis (HCC) [F33.3] 12/27/2017  . Alcohol dependence with alcohol-induced mood disorder (HCC) [F10.24]   . 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 Unspecified depressive disorder [F32.9] 03/19/2016  . Cocaine use disorder, severe, dependence (HCC) [F14.20] 03/19/2016  . Opioid use disorder, moderate, dependence (HCC) [F11.20] 03/19/2016  . Cannabis use disorder, severe, dependence (HCC) [F12.20] 03/19/2016  . Tobacco use disorder [F17.200] 03/19/2016  . 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 Benzodiazepine withdrawal (HCC) [F13.239] 03/19/2016    Total Time spent with patient: 15 minutes  Musculoskeletal: Strength & Muscle Tone: within normal limits Gait & Station: normal Patient leans: N/A  Psychiatric Specialty Exam: Review of Systems  All other systems reviewed and are negative.   Blood pressure 111/83, pulse 77, temperature 97.6 F (36.4 C), temperature source Oral, resp. rate 16, height 5\' 10"  (1.778 m), weight 66.7 kg.Body mass index is 21.09 kg/m.  General Appearance: Casual  Eye Contact::  Fair  Speech:  Normal Rate409  Volume:  Normal  Mood:  Euthymic  Affect:  Congruent  Thought Process:  Coherent and Descriptions of Associations: Intact  Orientation:  Full (Time, Place, and Person)  Thought Content:  Logical  Suicidal Thoughts:  No  Homicidal Thoughts:  No  Memory:  Immediate;   Fair Recent;   Fair Remote;   Fair  Judgement:  Intact  Insight:  Fair  Psychomotor Activity:  Normal  Concentration:  Fair  Recall:  [employer] of Knowledge:Fair  Language: Fair  Akathisia:  Negative  Handed:  Right  AIMS (if  indicated):     Assets:  Communication Skills Desire for Improvement Housing Physical Health Resilience Social Support  Sleep:  Number of Hours: 6.25  Cognition: WNL  ADL's:  Intact   Mental Status Per Nursing Assessment::   On Admission:  Suicidal ideation indicated by patient  Demographic Factors:  Male, Adolescent or young adult, Caucasian, Low socioeconomic status and Unemployed  Loss Factors: NA  Historical Factors: Impulsivity  Risk Reduction Factors:   Living with another person, especially a relative and Positive social support  Continued Clinical Symptoms:  Depression:   Comorbid alcohol abuse/dependence Impulsivity Alcohol/Substance Abuse/Dependencies  Cognitive Features That Contribute To Risk:  None    Suicide Risk:  Minimal: No identifiable suicidal ideation.  Patients presenting with no risk factors but with morbid ruminations; may be classified as minimal risk based on the severity of the depressive symptoms  Follow-up Information    [facility] Go on 01/06/2018.   Why:  Hospital follow up is Wednesday, 01/06/18 at 10:00am. Be sure to bring your Photo ID, SSN, insurane information and any discharge paperwork from this hospitalization.  Contact information: [address] [place] [address] [phone]           Plan Of Care/Follow-up recommendations:  Activity:  ad lib  Antonieta Pert, MD 12/30/2017, 8:40 AM

## 2017-12-30 NOTE — Discharge Summary (Signed)
Physician Discharge Summary Note  Patient:  David Lowe is an 27 y.o., male MRN:  [id number] DOB:  11-29-1990 Patient phone:  [phone] (home)  Patient address:   [address] [place] [place] [address],  Total Time spent with patient: 30 minutes  Date of Admission:  12/27/2017 Date of Discharge: 12/30/2017  Reason for Admission:  Patient admitted to [facility] [place] [facility] for inpatient psychiatric treatment after present to [facility] ED with complaints of worsening depression, anxiety, suicidal ideation.  Patient also had complaints of abusing alcohol, cocaine, xanax, and marijuana.  Principal Problem: MDD (major depressive disorder), recurrent, severe, with psychosis [facility]) Discharge Diagnoses: Patient Active Problem List   Diagnosis Date Noted  . Substance abuse (HCC) [F19.10] 12/27/2017  . MDD (major depressive disorder), recurrent, severe, with psychosis (HCC) [F33.3] 12/27/2017  . Alcohol dependence with alcohol-induced mood disorder (HCC) [F10.24]   . 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 Unspecified depressive disorder [F32.9] 03/19/2016  . Cocaine use disorder, severe, dependence (HCC) [F14.20] 03/19/2016  . Opioid use disorder, moderate, dependence (HCC) [F11.20] 03/19/2016  . Cannabis use disorder, severe, dependence (HCC) [F12.20] 03/19/2016  . Tobacco use disorder [F17.200] 03/19/2016  . 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 Benzodiazepine withdrawal [facility]) [F13.239] 03/19/2016    Past Psychiatric History: Prior inpatient psychiatric treatment 2017; Polysubstance abuse, Depression  Past Medical History:  Past Medical History:  Diagnosis Date  . Anxiety attack   . MDD (major depressive disorder)    History reviewed. No pertinent surgical history. Family History: History reviewed. No pertinent family history. Family Psychiatric  History: Denies Social History:  Social History   Substance and Sexual Activity  Alcohol Use Yes   Comment: last  use today,      Social History   Substance and Sexual Activity  Drug Use Yes  . Types: Marijuana, Cocaine, Benzodiazepines   Comment: Takes xxanax - not prescribed, percocet     Social History   Socioeconomic History  . Marital status: Single    Spouse name: Not on file  . Number of children: Not on file  . Years of education: Not on file  . Highest education level: Not on file  Occupational History  . Not on file  Social Needs  . Financial resource strain: Not on file  . Food insecurity:    Worry: Not on file    Inability: Not on file  . Transportation needs:    Medical: Not on file    Non-medical: Not on file  Tobacco Use  . Smoking status: Current Every Day Smoker    Packs/day: 0.50    Types: Cigarettes  . Smokeless tobacco: Never Used  . Tobacco comment: declined  Substance and Sexual Activity  . Alcohol use: Yes    Comment: last use today,   . Drug use: Yes    Types: Marijuana, Cocaine, Benzodiazepines    Comment: Takes xxanax - not prescribed, percocet   . Sexual activity: Yes  Lifestyle  . Physical activity:    Days per week: Not on file    Minutes per session: Not on file  . Stress: Not on file  Relationships  . Social connections:    Talks on phone: Not on file    Gets together: Not on file    Attends religious service: Not on file    Active member of club or organization: Not on file    Attends meetings of clubs or organizations: Not on file    Relationship status:  Not on file  Other Topics Concern  . Not on file  Social History Narrative  . Not on file    Hospital Course:  Jarold Macomber was admitted for MDD (major depressive disorder), recurrent, severe, with psychosis (HCC) and crisis management.  He was treated with the following medications Librium detox protocol for Xanax and alcohol detox; Remeron for insomnia; Zyprexa for Major depression and psychosis; and Vistaril for anxiety; .  Ly Moccio was discharged with current medication and was  instructed on how to take medications as prescribed; (details listed below under Medication List).  Medical problems were identified and treated as needed.  Home medications were restarted as appropriate.  Improvement was monitored by observation and daily report of symptom reduction by Linwood Dibbles.  Emotional and mental status was monitored by daily self-inventory reports completed by Linwood Dibbles and clinical staff.         Jacksen Zuercher was evaluated by the treatment team for stability and plans for continued recovery upon discharge.  Bush Dalto motivation was an integral factor for scheduling further treatment.  Employment, transportation, bed availability, health status, family support, and any pending legal issues were also considered during his hospital stay.  He was offered further treatment options upon discharge including but not limited to Residential, Intensive Outpatient, and Outpatient treatment.  Lowen Skelton will follow up with the services as listed below under Follow Up Information.     Upon completion of this admission the Breylon Sherrow was both mentally and medically stable for discharge denying suicidal/homicidal ideation, auditory/visual/tactile hallucinations, delusional thoughts and paranoia.      Physical Findings: AIMS: Facial and Oral Movements Muscles of Facial Expression: None, normal Lips and Perioral Area: None, normal Jaw: None, normal Tongue: None, normal,Extremity Movements Upper (arms, wrists, hands, fingers): None, normal Lower (legs, knees, ankles, toes): None, normal, Trunk Movements Neck, shoulders, hips: None, normal, Overall Severity Severity of abnormal movements (highest score from questions above): None, normal Incapacitation due to abnormal movements: None, normal Patient's awareness of abnormal movements (rate only patient's report): No Awareness, Dental Status Current problems with teeth and/or dentures?: No Does patient usually wear dentures?: No  CIWA:  CIWA-Ar  Total: 1 COWS:      Musculoskeletal: Strength & Muscle Tone: within normal limits Gait & Station: normal Patient leans: N/A Psychiatric Specialty Exam: Physical Exam  ROS  Blood pressure 111/83, pulse 77, temperature 97.6 F (36.4 C), temperature source Oral, resp. rate 16, height 5\' 10"  (1.778 m), weight 66.7 kg.Body mass index is 21.09 kg/m.   General Appearance: Casual  Eye Contact::  Fair  Speech:  Normal   Volume:  Normal  Mood:  Euthymic  Affect:  Congruent  Thought Process:  Coherent and Descriptions of Associations: Intact  Orientation:  Full (Time, Place, and Person)  Thought Content:  Logical  Suicidal Thoughts:  No  Homicidal Thoughts:  No  Memory:  Immediate;   Fair Recent;   Fair Remote;   Fair  Judgement:  Intact  Insight:  Fair  Psychomotor Activity:  Normal  Concentration:  Fair  Recall:  [employer] of Knowledge:Fair  Language: Fair  Akathisia:  Negative  Handed:  Right  AIMS (if indicated):     Assets:  Communication Skills Desire for Improvement Housing Physical Health Resilience Social Support  Sleep:  Number of Hours: 6.25  Cognition: WNL  ADL's:  Intact     Have you used any form of tobacco in the last 30 days? (Cigarettes, Smokeless Tobacco, Cigars,  and/or Pipes): Yes  Has this patient used any form of tobacco in the last 30 days? (Cigarettes, Smokeless Tobacco, Cigars, and/or Pipes) Yes, Yes, A prescription for an FDA-approved tobacco cessation medication was offered at discharge and the patient refused  Blood Alcohol level:  Lab Results  Component Value Date   ETH 84 (H) 12/26/2017   ETH <5 03/17/2016    Metabolic Disorder Labs:  Lab Results  Component Value Date   HGBA1C 5.0 03/19/2016   MPG 97 03/19/2016   Lab Results  Component Value Date   PROLACTIN 12.3 03/19/2016   Lab Results  Component Value Date   CHOL 152 03/19/2016   TRIG 83 03/19/2016   HDL 43 03/19/2016   CHOLHDL 3.5 03/19/2016   VLDL 17 03/19/2016    LDLCALC 92 03/19/2016    See Psychiatric Specialty Exam and Suicide Risk Assessment completed by Attending Physician prior to discharge.  Discharge destination:  Home  Is patient on multiple antipsychotic therapies at discharge:  No   Has Patient had three or more failed trials of antipsychotic monotherapy by history:  No  Recommended Plan for Multiple Antipsychotic Therapies: NA  Discharge Instructions    Diet - low sodium heart healthy   Complete by:  As directed    Diet - low sodium heart healthy   Complete by:  As directed    Discharge instructions   Complete by:  As directed    Take all of you medications as prescribed by your mental healthcare provider.  Report any adverse effects and reactions from your medications to your outpatient provider promptly.  Do not engage in alcohol and or illegal drug use while on prescription medicines. Keep all scheduled appointments. This is to ensure that you are getting refills on time and to avoid any interruption in your medication.  If you are unable to keep an appointment call to reschedule.  Be sure to follow up with resources and follow ups given. In the event of worsening symptoms call the crisis hotline, 911, and or go to the nearest emergency department for appropriate evaluation and treatment of symptoms. Follow-up with your primary care provider for your medical issues, concerns and or health care needs.   Increase activity slowly   Complete by:  As directed    Increase activity slowly   Complete by:  As directed      Allergies as of 12/30/2017      Reactions   Bactericin [bacitracin] Hives   Keflet [cephalexin] Hives   Seroquel [quetiapine] Itching   Zyrtec [cetirizine] Other (See Comments)   Makes patient stay awake      Medication List    STOP taking these medications   acetaminophen 325 MG tablet Commonly known as:  TYLENOL   citalopram 20 MG tablet Commonly known as:  CELEXA     TAKE these medications      Indication  hydrOXYzine 25 MG tablet Commonly known as:  ATARAX/VISTARIL Take 1 tablet (25 mg total) by mouth 3 (three) times daily as needed for anxiety.  Indication:  Feeling Anxious   ibuprofen 200 MG tablet Commonly known as:  ADVIL,MOTRIN Take 400 mg by mouth every 6 (six) hours as needed for headache or mild pain.  Indication:  Fever, Mild to Moderate Pain   mirtazapine 7.5 MG tablet Commonly known as:  REMERON Take 1 tablet (7.5 mg total) by mouth at bedtime. What changed:    medication strength  how much to take  Indication:  Sleep  OLANZapine 10 MG tablet Commonly known as:  ZYPREXA Take 1 tablet (10 mg total) by mouth at bedtime.  Indication:  Major Depressive Disorder      Follow-up Information    [facility] Go on 01/06/2018.   Why:  Hospital follow up is Wednesday, 01/06/18 at 10:00am. Be sure to bring your Photo ID, SSN, insurane information and any discharge paperwork from this hospitalization.  Contact information: [address] [place] [address] [phone]           Follow-up recommendations:  Activity:  As tolerated Diet:  Heart healthy  Comments:  Challen Wadle has been instructed to take medications as prescribed; and report adverse effects to outpatient provider.  Follow up with primary doctor for any medical issues and If symptoms recur report to nearest emergency or crisis hot line.    SignedAssunta Found, NP 12/30/2017, 10:22 AM

## 2017-12-30 NOTE — Progress Notes (Signed)
Patient self inventory- Patient slept well last night, sleep medication was not requested. Appetite has been good. Depression, hopelessness, and anxiety rated all 0/10. Denies SI HI AVH. Goal is to speak with CSW.  Safety maintained with 15 minute checks.

## 2018-03-16 ENCOUNTER — Emergency Department (HOSPITAL_COMMUNITY)
Admission: EM | Admit: 2018-03-16 | Discharge: 2018-03-16 | Disposition: A | Discharge status: Home / Self Care | Payer: Self-pay | Attending: Emergency Medicine | Admitting: Emergency Medicine

## 2018-03-16 ENCOUNTER — Encounter (HOSPITAL_COMMUNITY): Payer: Self-pay | Admitting: Emergency Medicine

## 2018-03-16 ENCOUNTER — Emergency Department (HOSPITAL_COMMUNITY): Payer: Self-pay

## 2018-03-16 ENCOUNTER — Other Ambulatory Visit: Payer: Self-pay

## 2018-03-16 DIAGNOSIS — Y929 Unspecified place or not applicable: Secondary | ICD-10-CM | POA: Insufficient documentation

## 2018-03-16 DIAGNOSIS — F329 Major depressive disorder, single episode, unspecified: Secondary | ICD-10-CM | POA: Insufficient documentation

## 2018-03-16 DIAGNOSIS — F1120 Opioid dependence, uncomplicated: Secondary | ICD-10-CM | POA: Insufficient documentation

## 2018-03-16 DIAGNOSIS — Y998 Other external cause status: Secondary | ICD-10-CM | POA: Insufficient documentation

## 2018-03-16 DIAGNOSIS — Y93H3 Activity, building and construction: Secondary | ICD-10-CM | POA: Insufficient documentation

## 2018-03-16 DIAGNOSIS — F17210 Nicotine dependence, cigarettes, uncomplicated: Secondary | ICD-10-CM | POA: Insufficient documentation

## 2018-03-16 DIAGNOSIS — F1420 Cocaine dependence, uncomplicated: Secondary | ICD-10-CM | POA: Insufficient documentation

## 2018-03-16 DIAGNOSIS — S20212A Contusion of left front wall of thorax, initial encounter: Secondary | ICD-10-CM | POA: Insufficient documentation

## 2018-03-16 DIAGNOSIS — F1220 Cannabis dependence, uncomplicated: Secondary | ICD-10-CM | POA: Insufficient documentation

## 2018-03-16 DIAGNOSIS — F1020 Alcohol dependence, uncomplicated: Secondary | ICD-10-CM | POA: Insufficient documentation

## 2018-03-16 DIAGNOSIS — W01198A Fall on same level from slipping, tripping and stumbling with subsequent striking against other object, initial encounter: Secondary | ICD-10-CM | POA: Insufficient documentation

## 2018-03-16 MED ORDER — KETOROLAC TROMETHAMINE 60 MG/2ML IM SOLN
60.0000 mg | Freq: Once | INTRAMUSCULAR | Status: AC
  Administered 2018-03-16: 60 mg via INTRAMUSCULAR
  Filled 2018-03-16: qty 2

## 2018-03-16 MED ORDER — METHOCARBAMOL 500 MG PO TABS: 1000.0000 mg | ORAL_TABLET | Freq: Three times a day (TID) | ORAL | 0 refills | Status: DC | PRN

## 2018-03-16 MED ORDER — ACETAMINOPHEN 500 MG PO TABS
1000.0000 mg | ORAL_TABLET | Freq: Once | ORAL | Status: AC
  Administered 2018-03-16: 1000 mg via ORAL
  Filled 2018-03-16: qty 2

## 2018-03-16 MED ORDER — METHOCARBAMOL 500 MG PO TABS
1000.0000 mg | ORAL_TABLET | Freq: Once | ORAL | Status: AC
  Administered 2018-03-16: 1000 mg via ORAL
  Filled 2018-03-16: qty 2

## 2018-03-16 MED ORDER — IBUPROFEN 600 MG PO TABS: 600.0000 mg | ORAL_TABLET | Freq: Four times a day (QID) | ORAL | 0 refills | Status: DC | PRN

## 2018-03-16 NOTE — ED Triage Notes (Signed)
Pt c/o fall while building a dock and fell on wooden floor joists x 3-4 hrs ago, denies hitting head or LOC, c/o left rib pain, bruising noted

## 2018-03-16 NOTE — ED Provider Notes (Signed)
[facility] EMERGENCY DEPARTMENT Provider Note   CSN: [id number] Arrival date & time: 03/16/18  2005     History   Chief Complaint Chief Complaint  Patient presents with  . Fall    HPI Micco Ground is a 27 y.o. male.  HPI Patient states that he fell several hours ago and landed his left ribs.  He denies any head or neck injury.  Complaining of left rib pain worse with movement.  No abdominal pain. Past Medical History:  Diagnosis Date  . Anxiety attack   . MDD (major depressive disorder)     Patient Active Problem List   Diagnosis Date Noted  . Substance abuse (HCC) 12/27/2017  . 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 Alcohol dependence with alcohol-induced mood disorder (HCC)   . Benzodiazepine abuse (HCC)   . Unspecified depressive disorder 03/19/2016  . Cocaine use disorder, severe, dependence (HCC) 03/19/2016  . Opioid use disorder, moderate, dependence (HCC) 03/19/2016  . Cannabis use disorder, severe, dependence (HCC) 03/19/2016  . Tobacco use disorder 03/19/2016  . 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 Benzodiazepine withdrawal (HCC) 03/19/2016    History reviewed. No pertinent surgical history.      Home Medications    Prior to Admission medications   Medication Sig Start Date End Date Taking? Authorizing Provider  hydrOXYzine (ATARAX/VISTARIL) 25 MG tablet Take 1 tablet (25 mg total) by mouth 3 (three) times daily as needed for anxiety. Patient not taking: Reported on 03/16/2018 12/30/17   Rankin, Shuvon B, NP  ibuprofen (ADVIL,MOTRIN) 600 MG tablet Take 1 tablet (600 mg total) by mouth every 6 (six) hours as needed. 03/16/18   Loren Racer, MD  methocarbamol (ROBAXIN) 500 MG tablet Take 2 tablets (1,000 mg total) by mouth every 8 (eight) hours as needed for muscle spasms. 03/16/18   Loren Racer, MD  mirtazapine (REMERON) 7.5 MG tablet Take 1 tablet (7.5 mg total) by mouth  at bedtime. Patient not taking: Reported on 03/16/2018 12/30/17   Rankin, Shuvon B, NP  OLANZapine (ZYPREXA) 10 MG tablet Take 1 tablet (10 mg total) by mouth at bedtime. Patient not taking: Reported on 03/16/2018 12/30/17   Rankin, Denice Bors B, NP    Family History History reviewed. No pertinent family history.  Social History Social History   Tobacco Use  . Smoking status: Current Every Day Smoker    Packs/day: 0.50    Types: Cigarettes  . Smokeless tobacco: Never Used  . Tobacco comment: declined  Substance Use Topics  . Alcohol use: Yes    Comment: last use today,   . Drug use: Yes    Types: Marijuana, Cocaine, Benzodiazepines    Comment: Takes xxanax - not prescribed, percocet      Allergies   Bactericin [bacitracin]; Keflet [cephalexin]; Seroquel [quetiapine]; Trazodone; and Zyrtec [cetirizine]   Review of Systems Review of Systems  Constitutional: Negative for chills and fever.  Respiratory: Negative for shortness of breath.   Cardiovascular: Positive for chest pain.  Gastrointestinal: Negative for abdominal pain, nausea and vomiting.  Musculoskeletal: Positive for back pain and myalgias. Negative for neck pain.  Skin: Positive for wound.  Neurological: Negative for dizziness, weakness, light-headedness, numbness and headaches.  All other systems reviewed and are negative.    Physical Exam Updated Vital Signs BP 123/77 (BP Location: Right Arm)   Pulse 77   Temp 98.1 F (36.7 C) (Oral)   Resp 18   Ht 5\' 10"  (1.778 m)  Wt 74.8 kg   SpO2 93%   BMI 23.68 kg/m   Physical Exam  Constitutional: He is oriented to person, place, and time. He appears well-developed and well-nourished. No distress.  HENT:  Head: Normocephalic and atraumatic.  Mouth/Throat: Oropharynx is clear and moist. No oropharyngeal exudate.  Eyes: Pupils are equal, round, and reactive to light. EOM are normal.  Neck: Normal range of motion. Neck supple.  No posterior midline cervical  tenderness to palpation.  Cardiovascular: Normal rate and regular rhythm.  Pulmonary/Chest: Effort normal and breath sounds normal. No respiratory distress. He has no wheezes. He has no rales. He exhibits tenderness.  Left lateral chest wall abrasions and tenderness to palpation.  No crepitance or focal deformity.  Abdominal: Soft. Bowel sounds are normal. He exhibits no distension and no mass. There is no tenderness. There is no rebound and no guarding. No hernia.  Musculoskeletal: Normal range of motion. He exhibits no edema or tenderness.  No midline thoracic or lumbar tenderness.  Patient does have posterior left-sided rib tenderness to palpation.  Pelvis is stable.  Distal pulses are 2+.  Neurological: He is alert and oriented to person, place, and time.  Moving all extremities without focal deficit.  Sensation fully intact.  Skin: Skin is warm and dry. Capillary refill takes less than 2 seconds. No rash noted. He is not diaphoretic. No erythema.  Psychiatric: He has a normal mood and affect. His behavior is normal.  Nursing note and vitals reviewed.    ED Treatments / Results  Labs (all labs ordered are listed, but only abnormal results are displayed) Labs Reviewed - No data to display  EKG None  Radiology Dg Ribs Unilateral W/chest Left  Result Date: 03/16/2018 CLINICAL DATA:  Fall EXAM: LEFT RIBS AND CHEST - 3+ VIEW COMPARISON:  None. FINDINGS: No fracture or other bone lesions are seen involving the ribs. There is no evidence of pneumothorax or pleural effusion. Both lungs are clear. Heart size and mediastinal contours are within normal limits. IMPRESSION: Negative. Electronically Signed   By: Marlan Palauharles  Clark M.D.   On: 03/16/2018 20:49    Procedures Procedures (including critical care time)  Medications Ordered in ED Medications  ketorolac (TORADOL) injection 60 mg (60 mg Intramuscular Given 03/16/18 2110)  methocarbamol (ROBAXIN) tablet 1,000 mg (1,000 mg Oral Given  03/16/18 2110)  acetaminophen (TYLENOL) tablet 1,000 mg (1,000 mg Oral Given 03/16/18 2110)     Initial Impression / Assessment and Plan / ED Course  I have reviewed the triage vital signs and the nursing notes.  Pertinent labs & imaging results that were available during my care of the patient were reviewed by me and considered in my medical decision making (see chart for details).     X-ray without evidence of acute fracture.  Will treat symptomatically.  Low suspicion for intra-abdominal injury.  Vital signs are stable.  Final Clinical Impressions(s) / ED Diagnoses   Final diagnoses:  Chest wall contusion, left, initial encounter    ED Discharge Orders         Ordered    ibuprofen (ADVIL,MOTRIN) 600 MG tablet  Every 6 hours PRN     03/16/18 2119    methocarbamol (ROBAXIN) 500 MG tablet  Every 8 hours PRN     03/16/18 2119           Loren RacerYelverton, Shirla Hodgkiss, MD 03/16/18 2247

## 2018-03-16 NOTE — ED Notes (Signed)
Patient transported to X-ray 

## 2018-09-29 ENCOUNTER — Emergency Department (HOSPITAL_COMMUNITY): Admission: EM | Admit: 2018-09-29 | Discharge: 2018-09-29 | Discharge status: Against Medical Advice | Payer: Self-pay

## 2018-09-29 ENCOUNTER — Other Ambulatory Visit: Payer: Self-pay

## 2019-10-05 ENCOUNTER — Emergency Department
Admission: EM | Admit: 2019-10-05 | Discharge: 2019-10-05 | Disposition: A | Discharge status: Home / Self Care | Payer: Medicaid - Out of State | Attending: Student | Admitting: Student

## 2019-10-05 ENCOUNTER — Other Ambulatory Visit: Payer: Self-pay

## 2019-10-05 DIAGNOSIS — Z79899 Other long term (current) drug therapy: Secondary | ICD-10-CM | POA: Insufficient documentation

## 2019-10-05 DIAGNOSIS — Z23 Encounter for immunization: Secondary | ICD-10-CM | POA: Insufficient documentation

## 2019-10-05 DIAGNOSIS — F330 Major depressive disorder, recurrent, mild: Secondary | ICD-10-CM | POA: Insufficient documentation

## 2019-10-05 DIAGNOSIS — Z046 Encounter for general psychiatric examination, requested by authority: Secondary | ICD-10-CM

## 2019-10-05 DIAGNOSIS — F17210 Nicotine dependence, cigarettes, uncomplicated: Secondary | ICD-10-CM | POA: Diagnosis not present

## 2019-10-05 DIAGNOSIS — F14929 Cocaine use, unspecified with intoxication, unspecified: Secondary | ICD-10-CM | POA: Diagnosis not present

## 2019-10-05 DIAGNOSIS — F1990 Other psychoactive substance use, unspecified, uncomplicated: Secondary | ICD-10-CM

## 2019-10-05 DIAGNOSIS — R451 Restlessness and agitation: Secondary | ICD-10-CM | POA: Diagnosis present

## 2019-10-05 LAB — COMPREHENSIVE METABOLIC PANEL
ALT: 13 U/L (ref 0–44)
AST: 17 U/L (ref 15–41)
Albumin: 4.2 g/dL (ref 3.5–5.0)
Alkaline Phosphatase: 67 U/L (ref 38–126)
Anion gap: 7 (ref 5–15)
BUN: 20 mg/dL (ref 6–20)
CO2: 30 mmol/L (ref 22–32)
Calcium: 9.1 mg/dL (ref 8.9–10.3)
Chloride: 105 mmol/L (ref 98–111)
Creatinine, Ser: 1.2 mg/dL (ref 0.61–1.24)
GFR calc Af Amer: >60 mL/min (ref >60)
GFR calc non Af Amer: >60 mL/min (ref >60)
Glucose, Bld: 97 mg/dL (ref 70–99)
Potassium: 4.3 mmol/L (ref 3.5–5.1)
Sodium: 142 mmol/L (ref 135–145)
Total Bilirubin: 0.7 mg/dL (ref 0.3–1.2)
Total Protein: 6.8 g/dL (ref 6.5–8.1)

## 2019-10-05 LAB — URINE DRUG SCREEN, QUALITATIVE (ARMC ONLY)
Amphetamines, Ur Screen: NOT DETECTED
Barbiturates, Ur Screen: NOT DETECTED
Benzodiazepine, Ur Scrn: NOT DETECTED
Cannabinoid 50 Ng, Ur ~~LOC~~: NOT DETECTED
Cocaine Metabolite,Ur ~~LOC~~: POSITIVE — AB
MDMA (Ecstasy)Ur Screen: NOT DETECTED
Methadone Scn, Ur: NOT DETECTED
Opiate, Ur Screen: NOT DETECTED
Phencyclidine (PCP) Ur S: NOT DETECTED
Tricyclic, Ur Screen: NOT DETECTED

## 2019-10-05 LAB — TROPONIN I (HIGH SENSITIVITY)
Troponin I (High Sensitivity): 3 ng/L (ref <18)
Troponin I (High Sensitivity): <2 ng/L (ref <18)

## 2019-10-05 LAB — CBC
HCT: 45 % (ref 39.0–52.0)
Hemoglobin: 16.1 g/dL (ref 13.0–17.0)
MCH: 32.5 pg (ref 26.0–34.0)
MCHC: 35.8 g/dL (ref 30.0–36.0)
MCV: 90.9 fL (ref 80.0–100.0)
Platelets: 211 10*3/uL (ref 150–400)
RBC: 4.95 MIL/uL (ref 4.22–5.81)
RDW: 12.7 % (ref 11.5–15.5)
WBC: 9.4 10*3/uL (ref 4.0–10.5)
nRBC: 0 % (ref 0.0–0.2)

## 2019-10-05 LAB — ETHANOL: Alcohol, Ethyl (B): <10 mg/dL (ref <10)

## 2019-10-05 LAB — SALICYLATE LEVEL: Salicylate Lvl: <7 mg/dL — ABNORMAL LOW (ref 7.0–30.0)

## 2019-10-05 LAB — ACETAMINOPHEN LEVEL: Acetaminophen (Tylenol), Serum: <10 ug/mL — ABNORMAL LOW (ref 10–30)

## 2019-10-05 MED ORDER — TETANUS-DIPHTH-ACELL PERTUSSIS 5-2.5-18.5 LF-MCG/0.5 IM SUSP
0.5000 mL | Freq: Once | INTRAMUSCULAR | Status: AC
  Administered 2019-10-05: 0.5 mL via INTRAMUSCULAR
  Filled 2019-10-05: qty 0.5

## 2019-10-05 NOTE — ED Provider Notes (Signed)
[facility] Emergency Department Provider Note  ____________________________________________   First MD Initiated Contact with Patient 10/05/19 [phone]     (approximate)  I have reviewed the triage vital signs and the nursing notes.  History  Chief Complaint IVC    HPI David Lowe is a 29 y.o. male past medical history as below, who presents to the emergency department under IVC.  Per IVC paperwork, law enforcement was called to assist EMS for possible overdose. Patient reportedly found unresponsive in the restroom, grandfather had started chest compressions.  Law enforcement apparently found a plastic bag w/ residue that was concerning for possible synthetic cannabinoids. No narcan given.  The patient allegedly became combative with EMS and fled, had to be physically restrained.  Per patient's report, he had fallen asleep at home, woke up, realized he needed to brush his teeth and use the restroom. States he walked into the restroom, where his grandpa was. Asked to use the restroom. Next thing he remembers was waking up on the floor w/ his grandfather doing compressions on him and telling the grandmother to call 911. Then surrounded by EMS and PD. He has two small abrasions to the side of his face.  Denies any headache, neck pain, chest pain, abdominal pain, other extremity pain.  He does admit to cocaine use a few days ago, but denies any drug use yesterday. Denies any marijuana use or other drug use. Has no acute complaints at present.  Denies any history of cardiac disease or arrhythmias.  No family history of sudden cardiac death.   Past Medical Hx Past Medical History:  Diagnosis Date  . Anxiety attack   . MDD (major depressive disorder)     Problem List Patient Active Problem List   Diagnosis Date Noted  . Substance abuse (HCC) 12/27/2017  . 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 Alcohol dependence with  alcohol-induced mood disorder (HCC)   . Benzodiazepine abuse (HCC)   . Unspecified depressive disorder 03/19/2016  . Cocaine use disorder, severe, dependence (HCC) 03/19/2016  . Opioid use disorder, moderate, dependence (HCC) 03/19/2016  . Cannabis use disorder, severe, dependence (HCC) 03/19/2016  . Tobacco use disorder 03/19/2016  . 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 Benzodiazepine withdrawal (HCC) 03/19/2016    Past Surgical Hx History reviewed. No pertinent surgical history.  Medications Prior to Admission medications   Medication Sig Start Date End Date Taking? Authorizing Provider  hydrOXYzine (ATARAX/VISTARIL) 25 MG tablet Take 1 tablet (25 mg total) by mouth 3 (three) times daily as needed for anxiety. Patient not taking: Reported on 03/16/2018 12/30/17   Rankin, Shuvon B, NP  ibuprofen (ADVIL,MOTRIN) 600 MG tablet Take 1 tablet (600 mg total) by mouth every 6 (six) hours as needed. 03/16/18   Loren Racer, MD  methocarbamol (ROBAXIN) 500 MG tablet Take 2 tablets (1,000 mg total) by mouth every 8 (eight) hours as needed for muscle spasms. 03/16/18   Loren Racer, MD  mirtazapine (REMERON) 7.5 MG tablet Take 1 tablet (7.5 mg total) by mouth at bedtime. Patient not taking: Reported on 03/16/2018 12/30/17   Rankin, Shuvon B, NP  OLANZapine (ZYPREXA) 10 MG tablet Take 1 tablet (10 mg total) by mouth at bedtime. Patient not taking: Reported on 03/16/2018 12/30/17   Rankin, Denice Bors B, NP    Allergies Bactericin [bacitracin], Keflet [cephalexin], Seroquel [quetiapine], Trazodone, and Zyrtec [cetirizine]  Family Hx No family history on file.  Social Hx Social History  Tobacco Use  . Smoking status: Current Every Day Smoker    Packs/day: 0.50    Types: Cigarettes  . Smokeless tobacco: Never Used  . Tobacco comment: declined  Vaping Use  . Vaping Use: Never used  Substance Use Topics  . Alcohol use: Not Currently    Comment: last use  today,   . Drug use: Not Currently    Types: Marijuana, Cocaine, Benzodiazepines    Comment: Takes xxanax - not prescribed, percocet      Review of Systems  Constitutional: Negative for fever, chills. Eyes: Negative for visual changes. ENT: Negative for sore throat. Cardiovascular: Negative for chest pain. Respiratory: Negative for shortness of breath. Gastrointestinal: Negative for nausea, vomiting.  Genitourinary: Negative for dysuria. Musculoskeletal: Negative for leg swelling. Skin: Negative for rash. + abrasion Neurological: Negative for for headaches. + syncope   Physical Exam  Vital Signs: ED Triage Vitals  Enc Vitals Group     BP 10/05/19 0257 120/70     Pulse Rate 10/05/19 0257 64     Resp --      Temp 10/05/19 0257 98 F (36.7 C)     Temp Source 10/05/19 0257 Oral     SpO2 10/05/19 0257 100 %     Weight 10/05/19 0249 160 lb (72.6 kg)     Height 10/05/19 0249 5\' 10"  (1.778 m)     Head Circumference --      Peak Flow --      Pain Score 10/05/19 0258 0     Pain Loc --      Pain Edu? --      Excl. in GC? --     Constitutional: Alert and oriented.  Head: Normocephalic. Two small superficial abrasions to the right side of his face.  No lacerations.  Hemostatic.  Midface is stable. Eyes: Conjunctivae clear. Sclera anicteric. Nose: No congestion. No rhinorrhea.  No epistaxis. Mouth/Throat: Mucous membranes are moist.  No intraoral or dental trauma. Neck: No stridor.  FROM without discomfort. Cardiovascular: Normal rate.  No murmurs.  Extremities well perfused.  Chest wall stable, non-tender.  No palpable step-offs or deformities. Respiratory: Normal respiratory effort.  Lungs clear bilaterally. Gastrointestinal: Non-distended.  Musculoskeletal: No deformities.  FROM BUE and BLE. Neurologic:  Normal speech and language. No gross focal neurologic deficits are appreciated.  Skin: Skin is warm, dry and intact.  Psychiatric: Calm and cooperative.  Denies SI or  HI.  EKG  EKG personally reviewed and interpreted by myself.  10/05/2019 - 0256 Rate: 63 Rhythm: Sinus Axis: Normal Intervals: Within normal limits Likely benign early repol No evidence of Brugada, prolonged QTC, or WPW  Repeat at 1011 Rate: 76 Rhythm: sinus Axis: normal Intervals: WNL Benign early repol No acute ischemic changes No acute arrhythmia   Radiology  N/A   Procedures  Procedure(s) performed (including critical care):  Procedures   Initial Impression / Assessment and Plan / ED Course  29 y.o. male who presents to the ED under IVC, details as above.  Patient denies any recent drug use and states he essentially passed out using the restroom to find EMS and PD surrounding him.  Differential includes substance use, electrolyte abnormality, arrhythmia, micturition/orthostatic/vasovagal syncope. No family hx of sudden cardiac death. UDS positive for cocaine, he does admit to this several days ago.  Otherwise, work-up has been largely unremarkable.  Electrolytes within normal limits.  Troponin x 2 negative.  EKG without acute ischemic changes or arhythmia, likely benign repol.  No focal neurological  deficits.  Patient medically cleared for psychiatric evaluation.  Will obtain psychiatry and TTS consult given his IVC as above.  Patient is calm and cooperative and agreeable.    The patient has been placed in psychiatric observation due to the need to provide a safe environment for the patient while obtaining psychiatric consultation and evaluation, as well as ongoing medical and medication management to treat the patient's condition.  The patient arrives to the ER under full IVC at this time.    Final Clinical Impression(s) / ED Diagnosis  IVC Loss of consciousness    Note:  This document was prepared using Dragon voice recognition software and may include unintentional dictation errors.   Lilia Pro., MD 10/05/19 1116

## 2019-10-05 NOTE — BH Assessment (Signed)
Assessment Note  David Lowe is an 29 y.o. male who presents to [facility] ED involuntarily for treatment. Per triage note, Pt to the er via sheriff dept for IVC. Pt was found unresponsive in the bathroom. Pt denies ETOH or drugs or medical hx. Pt has vomit on his shorts and down his back. Pt does not remember vomiting. Pt was not given narcan in the field. No HX of syncope. Grandfather performed CPR on pt to revive him.  During TTS assessment pt presented alert and oriented x 3, irritable but cooperative, and mood-congruent with affect. Pt does appear to be responding to internal and external stimuli. Neither is the pt presenting with any delusional thinking. Pt reports to be unsure why he is here stating "the last thing I remember is waking up to brush my teeth and falling, after that it's a blur". Pt reports to remember none of the information reported to triage RN. Pt denied a hx of fall spells, faints or seizures. Pt reports SA (Cocaine/alcohol) and his last use to be two days ago (2-3 lines of cocaine and a few shots). Pt denies a MH/INPT/OPT hx. Pt denies taking any prescribed medications and reports a family hx of MH (unsure of details) but denies a family hx of SA. During attempt to offer resources for SA pt stated "Mam I'm not interested, I'm not really trying to change anything I just need to go home". Pt denies struggles with depression, anxiety, sleeping or eating. 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 Pt denies any current SI/HI/AH/VH and contracted for safety.   Per Dr. Smith Robert pt will discharged back home   Diagnosis: Cocaine Intoxication  Past Medical History:  Past Medical History:  Diagnosis Date  . Anxiety attack   . MDD (major depressive disorder)     History reviewed. No pertinent surgical history.  Family History: No family history on file.  Social History:  reports that he has been smoking cigarettes. He has been smoking about 0.50 packs  per day. He has never used smokeless tobacco. He reports previous alcohol use. He reports previous drug use. Drugs: Marijuana, Cocaine, and Benzodiazepines.  Additional Social History:  Alcohol / Drug Use Pain Medications: see mar Prescriptions: see mar Over the Counter: see mar History of alcohol / drug use?: Yes Substance #1 Name of Substance 1: Cocaine Substance #2 Name of Substance 2: Alcohol  CIWA: CIWA-Ar BP: 120/70 Pulse Rate: 64 COWS:    Allergies:  Allergies  Allergen Reactions  . Bactericin [Bacitracin] Hives  . Keflet [Cephalexin] Hives  . Seroquel [Quetiapine] Itching  . Trazodone Itching  . Zyrtec [Cetirizine] Other (See Comments)    Makes patient stay awake    Home Medications: (Not in a hospital admission)   OB/GYN Status:  No LMP for male patient.  General Assessment Data Location of Assessment: [facility] ED TTS Assessment: In system Is this a Tele or Face-to-Face Assessment?: Face-to-Face Is this an Initial Assessment or a Re-assessment for this encounter?: Initial Assessment Patient Accompanied by:: N/A Language Other than English: No Living Arrangements: Other (Comment) (Private home ) What gender do you identify as?: Male Marital status: Single Maiden name: n/a Pregnancy Status: No Living Arrangements: Other relatives (Grandfather ) Can pt return to current living arrangement?: Yes Admission Status: Involuntary Petitioner: Police Is patient capable of signing voluntary admission?: Yes Referral Source: Other Insurance type: Medicaid  Medical Screening Exam [facility] Walk-in ONLY) Medical Exam completed: Yes  Crisis  Care Plan Living Arrangements: Other relatives Paediatric nurse ) Legal Guardian:  (self) Name of Psychiatrist: None reported  Name of Therapist: None reported   Education Status Is patient currently in school?: No Is the patient employed, 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 : No Suicidal Intent: No Has patient had any suicidal intent within the past 6 months prior to admission? : No Is patient at risk for suicide?: No Suicidal Plan?: No Has patient had any suicidal plan within the past 6 months prior to admission? : No Access to Means: No What has been your use of drugs/alcohol within the last 12 months?: Cocaine & alcohol  Previous Attempts/Gestures: No How many times?: 0 Other Self Harm Risks: None reported  Triggers for Past Attempts: None known Intentional Self Injurious Behavior: None Family Suicide History: No Recent stressful life event(s):  (None reported ) Persecutory voices/beliefs?: No Depression: No Depression Symptoms:  (None reported ) Substance abuse history and/or treatment for substance abuse?: Yes Suicide prevention information given to non-admitted patients: Not applicable  Risk to Others within the past 6 months Homicidal Ideation: No Does patient have any lifetime risk of violence toward others beyond the six months prior to admission? : No Thoughts of Harm to Others: No Current Homicidal Intent: No Current Homicidal Plan: No Access to Homicidal Means: No Identified Victim: n/a History of harm to others?: No Assessment of Violence: None Noted Violent Behavior Description: n/a Does patient have access to weapons?: No Criminal Charges Pending?: No Describe Pending Criminal Charges: Traffice violations  Does patient have a court date: Yes Court Date:  (August/2021) Is patient on probation?: Unknown  Psychosis Hallucinations: None noted Delusions: None noted  Mental Status Report Appearance/Hygiene: In scrubs Eye Contact: Good Motor Activity: Freedom of movement Speech: Logical/coherent Level of Consciousness: Alert Mood: Anxious, Pleasant Affect: Anxious Anxiety Level: Minimal Thought Processes: Coherent,  Relevant Judgement: Partial Orientation: Appropriate for developmental age Obsessive Compulsive Thoughts/Behaviors: None  Cognitive Functioning Concentration: Good Memory: Remote Intact, Recent Intact Is patient IDD: No Insight: Poor Impulse Control: Fair Appetite: Good Have you had any weight changes? : No Change Sleep: No Change Total Hours of Sleep: 8 Vegetative Symptoms: None  ADLScreening [facility] Assessment Services) Patient's cognitive ability adequate to safely complete daily activities?: Yes Patient able to express need for assistance with ADLs?: Yes Independently performs ADLs?: Yes (appropriate for developmental age)  Prior Inpatient Therapy Prior Inpatient Therapy: No  Prior Outpatient Therapy Prior Outpatient Therapy: No Does patient have an ACCT team?: No Does patient have Intensive In-House Services?  : No Does patient have [facility] services? : No Does patient have P4CC services?: No  ADL Screening (condition at time of admission) Patient's cognitive ability adequate to safely complete daily activities?: Yes Is the patient deaf or have difficulty hearing?: No Does the patient have difficulty seeing, even when wearing glasses/contacts?: No Does the patient have difficulty concentrating, remembering, or making decisions?: No Patient able to express need for assistance with ADLs?: Yes Does the patient have difficulty dressing or bathing?: No Independently performs ADLs?: Yes (appropriate for developmental age) Does the patient have difficulty walking or climbing stairs?: No Weakness of Legs: None Weakness of Arms/Hands: None  Home Assistive Devices/Equipment Home Assistive Devices/Equipment: None  Therapy Consults (therapy consults require a physician order) PT Evaluation Needed: No OT Evalulation Needed: No SLP Evaluation  Needed: No Abuse/Neglect Assessment (Assessment to be complete while patient is alone) Abuse/Neglect Assessment Can Be Completed:  Yes Physical Abuse: Denies Verbal Abuse: Denies Sexual Abuse: Denies Exploitation of patient/patient's resources: Denies Self-Neglect: Denies Values / Beliefs Cultural Requests During Hospitalization: None Spiritual Requests During Hospitalization: None Consults Spiritual Care Consult Needed: No Transition of Care Team Consult Needed: No Advance Directives (For Healthcare) Does Patient Have a Medical Advance Directive?: No          Disposition:  Disposition Initial Assessment Completed for this Encounter: Yes Patient referred to: Other (Comment)  On Site Evaluation by:   Reviewed with Physician:    Opal Sidles 10/05/2019 2:09 PM

## 2019-10-05 NOTE — Discharge Instructions (Addendum)
Thank you for letting us take care of you in the emergency department today.  The best thing that you can do for your health moving forward is to stop/decrease substance use.  Information for the [facility] clinic as below to help with this, as desired.  Please establish and follow-up with a primary care doctor as well, information for 2 different clinics are listed below.  Return to the ER for any new or worsening symptoms.

## 2019-10-05 NOTE — ED Provider Notes (Addendum)
11:34 AM Patient has been seen and evaluated by psychiatry team and deemed appropriate for discharge. Patient determined not to be a threat to themselves or others. IVC rescinded by psychiatry team. Patient is otherwise medically clear and stable for discharge. He declines resources. Advised follow up w/ PCP to establish care and for further work up of his syncope, though I strongly suspect this was substance related.    David Lowe., MD 10/05/19 1139

## 2019-10-05 NOTE — ED Triage Notes (Signed)
Pt to the er via sheriff dept for IVC. Pt was found unresponsive in the bathroom. Pt denies ETOH or drugs or medical hx. Pt has vomit on his shorts and down his back. Pt does not remember vomiting. Pt was not given narcan in the field. No HX of syncope. Grandfather performed CPR on pt to revive him.

## 2019-10-05 NOTE — ED Notes (Signed)
Pt given clean paper scrubs and meal tray and discharged to lobby- pt states [place] PD told him they would take him home since they brought him in- pt instructed to wait in lobby while Amy, officer clarifies with [employer]

## 2019-10-05 NOTE — Consult Note (Signed)
[facility] Face-to-Face Psychiatry Consult   Reason for Consult:  Evaluate for inpatient care post intoxication of cocaine    Referring Physician:  ED MD   CC--" I am going home. "  Police had no right to bring me in"  Patient Identification: David Lowe MRN:  [id number] Principal Diagnosis: <principal problem not specified> Diagnosis:  Cocaine intoxication  Cocaine Dependence Bipolar disorder Mixed  Antisocial personality disorder    Total Time spent with patient: 25 minutes   Subjective:   David Lowe is a 29 y.o. male patient admitted with cocaine intoxication.  He was picked up by police after [place] called because of his cocaine intoxication where he passed out in bathroom and also required CPR.   He somewhat woke up and was confused and intoxicated.  He refused help and ran into woods requiring restraint and then was brought here.   He has a history of bipolar disorder non compliant and is supposed to take Zyprexa   He has at least 15 year history of cocaine dependence and is not interested in recovery  He has a history (related to primary bipolar and substance intoxication) of mood swings, ups and downs lability   IED symptoms ---shouting spells highs and lows, pressured speech loud speech and speeded actions.  He at times has mixed episodes with depressed mood sadness, hopeless helpless feelings, lack of energy motivation and concentration  He has no suicidal ideation hints or plans or Homicidal hints and plans   He lives with his grandad and is not currently working  He has a pervasive Optician, dispensing of struggle with authority, exploitation, issues with the law ---external blame, lack of remorse that points in the direction of antisocial traits  He has a sense of entitlement, Self centered traits as well --as narcissistic traits   Being evaluated this am for discharge    HPI:  See above   Past Psychiatric History:   He is too argumentative to get accurate history and remains  somewhat vague.  No recent hospitalizations---detox rehab AA NA or substance related programming   Social --no recent work history   Education --HS --mainly   Doctor, general practice and legal issues --he has had multiple charges in the past " but was never convicted of anything "---vague on this matter   Substance History ---mainly cocaine, but in the past has had amphetamines, ETOH   Marijuana ---police felt that there was a bag with synthetic Marijuana when they picked him up but he is denial of these matters    Risk to Self:  none  Risk to Others:  none  Prior Inpatient Therapy:  none  Prior Outpatient Therapy:  none recently patient has no interest he says in psych services per se   Past Medical History:  Past Medical History:  Diagnosis Date  . Anxiety attack   . MDD (major depressive disorder)    History reviewed. No pertinent surgical history. Family History: No family history on file. Family Psychiatric  History:  He says he cannot recall  Social History:  Social History   Substance and Sexual Activity  Alcohol Use Not Currently   Comment: last use today,      Social History   Substance and Sexual Activity  Drug Use Not Currently  . Types: Marijuana, Cocaine, Benzodiazepines   Comment: Takes xxanax - not prescribed, percocet     Social History   Socioeconomic History  . Marital status: Single    Spouse name: Not on file  . Number  of children: Not on file  . Years of education: Not on file  . Highest education level: Not on file  Occupational History  . Not on file  Tobacco Use  . Smoking status: Current Every Day Smoker    Packs/day: 0.50    Types: Cigarettes  . Smokeless tobacco: Never Used  . Tobacco comment: declined  Vaping Use  . Vaping Use: Never used  Substance and Sexual Activity  . Alcohol use: Not Currently    Comment: last use today,   . Drug use: Not Currently    Types: Marijuana, Cocaine, Benzodiazepines    Comment: Takes xxanax - not prescribed,  percocet   . Sexual activity: Yes  Other Topics Concern  . Not on file  Social History Narrative  . Not on file   Social Determinants of Health   Financial Resource Strain:   . Difficulty of Paying Living Expenses:   Food Insecurity:   . Worried About Programme researcher, broadcasting/film/video in the Last Year:   . Barista in the Last Year:   Transportation Needs:   . Freight forwarder (Medical):   Marland Kitchen Lack of Transportation (Non-Medical):   Physical Activity:   . Days of Exercise per Week:   . Minutes of Exercise per Session:   Stress:   . Feeling of Stress :   Social Connections:   . Frequency of Communication with Friends and Family:   . Frequency of Social Gatherings with Friends and Family:   . Attends Religious Services:   . Active Member of Clubs or Organizations:   . Attends Banker Meetings:   Marland Kitchen Marital Status:    Additional Social History: none     Allergies:   Allergies  Allergen Reactions  . Bactericin [Bacitracin] Hives  . Keflet [Cephalexin] Hives  . Seroquel [Quetiapine] Itching  . Trazodone Itching  . Zyrtec [Cetirizine] Other (See Comments)    Makes patient stay awake    Labs:  Results for orders placed or performed during the hospital encounter of 10/05/19 (from the past 48 hour(s))  Comprehensive metabolic panel     Status: None   Collection Time: 10/05/19  2:51 AM  Result Value Ref Range   Sodium 142 135 - 145 mmol/L   Potassium 4.3 3.5 - 5.1 mmol/L   Chloride 105 98 - 111 mmol/L   CO2 30 22 - 32 mmol/L   Glucose, Bld 97 70 - 99 mg/dL    Comment: Glucose reference range applies only to samples taken after fasting for at least 8 hours.   BUN 20 6 - 20 mg/dL   Creatinine, Ser 0.07 0.61 - 1.24 mg/dL   Calcium 9.1 8.9 - 62.2 mg/dL   Total Protein 6.8 6.5 - 8.1 g/dL   Albumin 4.2 3.5 - 5.0 g/dL   AST 17 15 - 41 U/L   ALT 13 0 - 44 U/L   Alkaline Phosphatase 67 38 - 126 U/L   Total Bilirubin 0.7 0.3 - 1.2 mg/dL   GFR calc non Af Amer >60  >60 mL/min   GFR calc Af Amer >60 >60 mL/min   Anion gap 7 5 - 15    Comment: Performed at [facility], [address], [place], [place] [address]  Ethanol     Status: None   Collection Time: 10/05/19  2:51 AM  Result Value Ref Range   Alcohol, Ethyl (B) <10 <10 mg/dL    Comment: (NOTE) Lowest detectable limit for serum  alcohol is 10 mg/dL.  For medical purposes only. Performed at [facility], [address] [place], [place] [address]   Salicylate level     Status: Abnormal   Collection Time: 10/05/19  2:51 AM  Result Value Ref Range   Salicylate Lvl <7.0 (L) 7.0 - 30.0 mg/dL    Comment: Performed at [facility], [address] [place], [place] [address]  Acetaminophen level     Status: Abnormal   Collection Time: 10/05/19  2:51 AM  Result Value Ref Range   Acetaminophen (Tylenol), Serum <10 (L) 10 - 30 ug/mL    Comment: (NOTE) Therapeutic concentrations vary significantly. A range of 10-30 ug/mL  may be an effective concentration for many patients. However, some  are best treated at concentrations outside of this range. Acetaminophen concentrations >150 ug/mL at 4 hours after ingestion  and >50 ug/mL at 12 hours after ingestion are often associated with  toxic reactions.  Performed at [facility], [address] [place], [place] [address]   cbc     Status: None   Collection Time: 10/05/19  2:51 AM  Result Value Ref Range   WBC 9.4 4.0 - 10.5 K/uL   RBC 4.95 4.22 - 5.81 MIL/uL   Hemoglobin 16.1 13.0 - 17.0 g/dL   HCT 93.2 39 - 52 %   MCV 90.9 80.0 - 100.0 fL   MCH 32.5 26.0 - 34.0 pg   MCHC 35.8 30.0 - 36.0 g/dL   RDW 67.1 24.5 - 80.9 %   Platelets 211 150 - 400 K/uL   nRBC 0.0 0.0 - 0.2 %    Comment: Performed at [facility], [address] [place], [place] [address]  Urine Drug Screen, Qualitative     Status: Abnormal   Collection Time: 10/05/19  2:51 AM  Result Value Ref Range   Tricyclic, Ur Screen NONE  DETECTED NONE DETECTED   Amphetamines, Ur Screen NONE DETECTED NONE DETECTED   MDMA (Ecstasy)Ur Screen NONE DETECTED NONE DETECTED   Cocaine Metabolite,Ur [place] POSITIVE (A) NONE DETECTED   Opiate, Ur Screen NONE DETECTED NONE DETECTED   Phencyclidine (PCP) Ur S NONE DETECTED NONE DETECTED   Cannabinoid 50 Ng, Ur [place] NONE DETECTED NONE DETECTED   Barbiturates, Ur Screen NONE DETECTED NONE DETECTED   Benzodiazepine, Ur Scrn NONE DETECTED NONE DETECTED   Methadone Scn, Ur NONE DETECTED NONE DETECTED    Comment: (NOTE) Tricyclics + metabolites, urine    Cutoff 1000 ng/mL Amphetamines + metabolites, urine  Cutoff 1000 ng/mL MDMA (Ecstasy), urine              Cutoff 500 ng/mL Cocaine Metabolite, urine          Cutoff 300 ng/mL Opiate + metabolites, urine        Cutoff 300 ng/mL Phencyclidine (PCP), urine         Cutoff 25 ng/mL Cannabinoid, urine                 Cutoff 50 ng/mL Barbiturates + metabolites, urine  Cutoff 200 ng/mL Benzodiazepine, urine              Cutoff 200 ng/mL Methadone, urine                   Cutoff 300 ng/mL  The urine drug screen provides only a preliminary, unconfirmed analytical test result and should not be used for non-medical purposes. Clinical consideration and professional judgment should be applied to any positive drug  screen result due to possible interfering substances. A more specific alternate chemical method must be used in order to obtain a confirmed analytical result. Gas chromatography / mass spectrometry (GC/MS) is the preferred confirm atory method. Performed at [facility], [address] [place], [place] [address]   Troponin I (High Sensitivity)     Status: None   Collection Time: 10/05/19  2:51 AM  Result Value Ref Range   Troponin I (High Sensitivity) 3 <18 ng/L    Comment: (NOTE) Elevated high sensitivity troponin I (hsTnI) values and significant  changes across serial measurements may suggest ACS but many other  chronic and  acute conditions are known to elevate hsTnI results.  Refer to the "Links" section for chest pain algorithms and additional  guidance. Performed at [facility], [address] [place], [place] [address]   Troponin I (High Sensitivity)     Status: None   Collection Time: 10/05/19 10:04 AM  Result Value Ref Range   Troponin I (High Sensitivity) <2 <18 ng/L    Comment: (NOTE) Elevated high sensitivity troponin I (hsTnI) values and significant  changes across serial measurements may suggest ACS but many other  chronic and acute conditions are known to elevate hsTnI results.  Refer to the "Links" section for chest pain algorithms and additional  guidance. Performed at [facility], [address] [place], [place] [address]     No current facility-administered medications for this encounter.   No current outpatient medications on file.    Musculoskeletal: Strength & Muscle Tone: normal  Gait & Station: normal  Patient leans: n/a   Psychiatric Specialty Exam: Physical Exam  Per ED   Review of Systems--8 systems looked at   Blood pressure 120/70, pulse 64, temperature 98 F (36.7 C), temperature source Oral, height 5\' 10"  (1.778 m), weight 72.6 kg, SpO2 100 %.Body mass index is 22.96 kg/m.  General Appearance: thin caucasian male Somewhat haggard and unkept\  Rapport poor, shouting arguing justifying In denial of issues   Eye Contact:  Normal   Speech:  Loud pressured normal otherwise   Volume:   Same   Mood:  Angry edgy irritable frustrated  Affect:  Angry   Thought Process:  Illogical denial external projection and blame  Orientation:  Times four okay  Thought Content:  Entitled justifying   Suicidal Thoughts:  None   Homicidal Thoughts:  None   Memory:  Normal recent remote and immediate thru general medical questions   Judgement:  Poor   Insight:  Poor  Psychomotor Activity:  Normal    Concentration:  Normal   Recall:  Normal   Fund of  Knowledge:  Fair to pooor   Language:  Fair   Akathisia:  Normal   Handed:  Not known   AIMS (if indicated):     Assets:  Caring grand dad    ADL's:  Normal   Cognition:  No new change   Sleep:   no change without substances      Treatment Plan Summary:  Caucasian male with history of polysubstance dependence mainly cocaine post intoxication and symptoms now came back down to baseline  He elects to go home and does not seek detox or rehab   Has no active SI HI or plans at discharge  IVC rescinded at this time   Non compliant on other medications      Disposition: Home to grandad at his request   , MD 10/05/2019 11:28 AM

## 2020-03-03 IMAGING — DX DG RIBS W/ CHEST 3+V*L*
5 series · 5 of 5 positions shown · non-contrast
Comparison: None.

CLINICAL DATA: Fall

EXAM:
LEFT RIBS AND CHEST - 3+ VIEW

[chest pa]
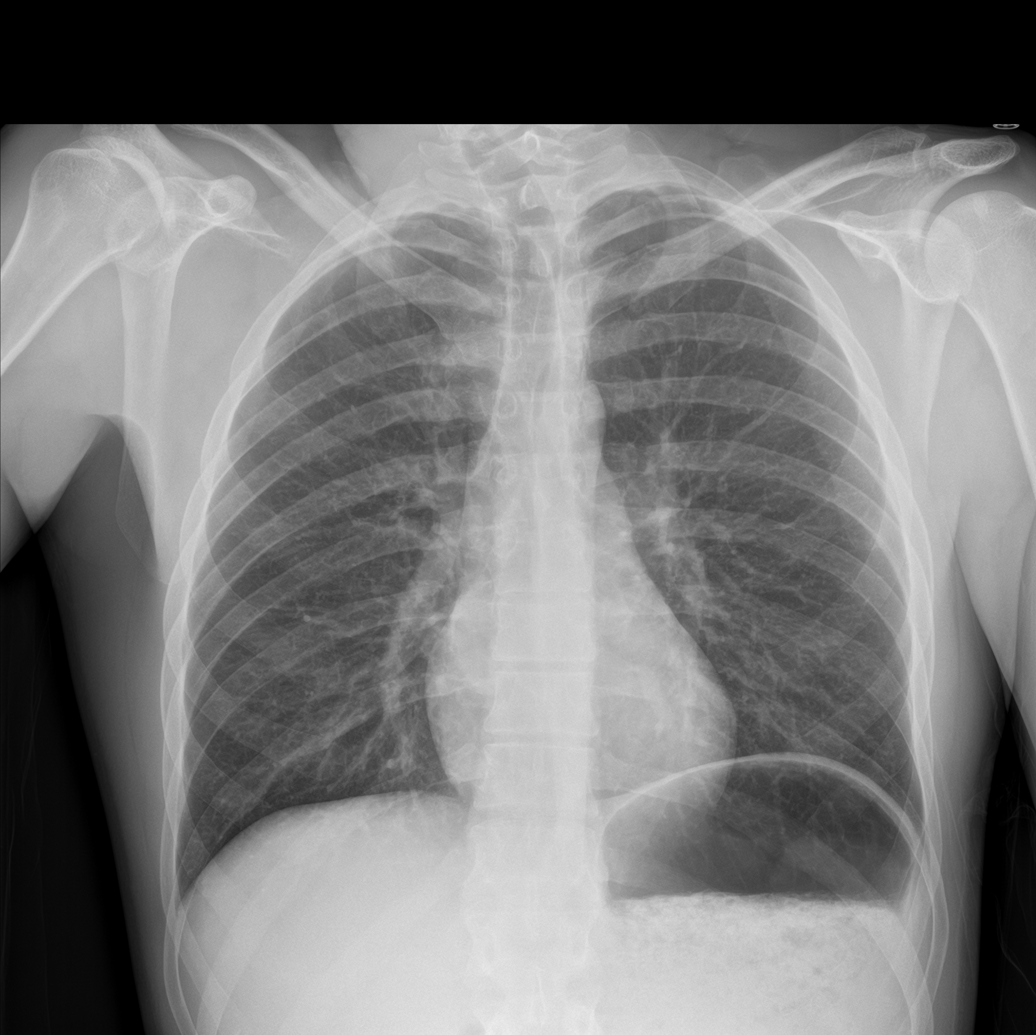

[rib pa obl (1 of 2)]
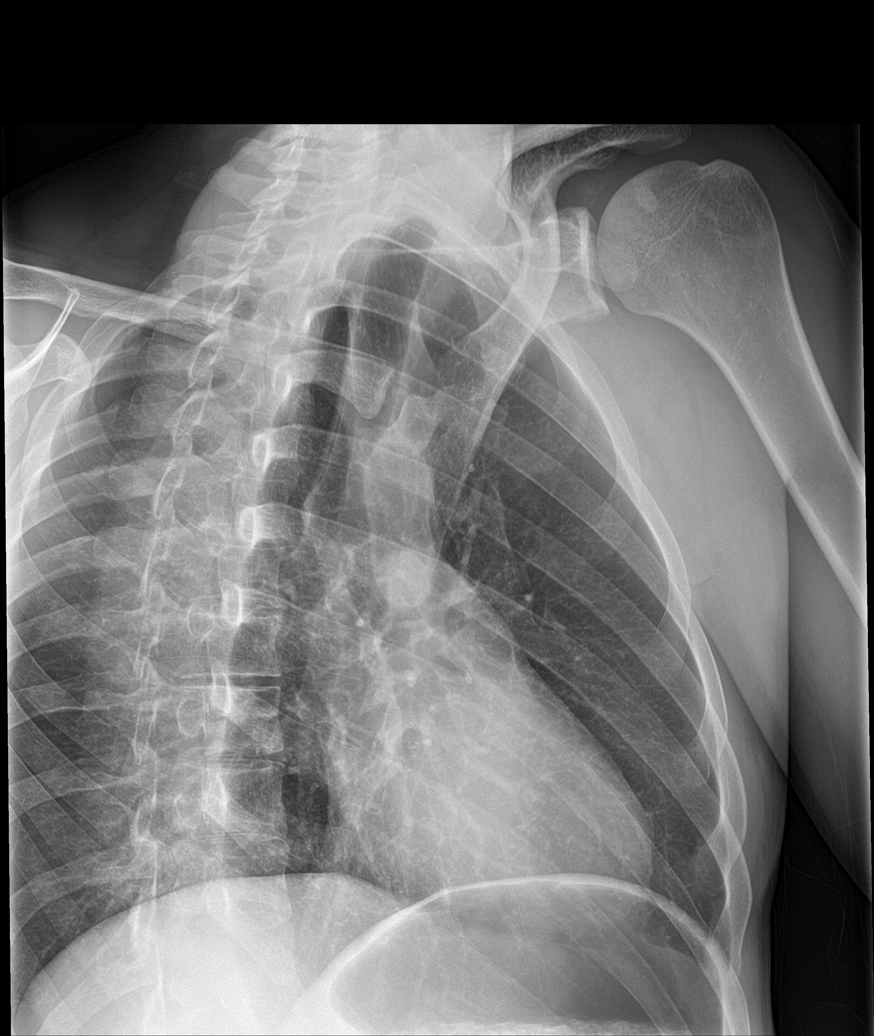

[rib pa obl (2 of 2)]
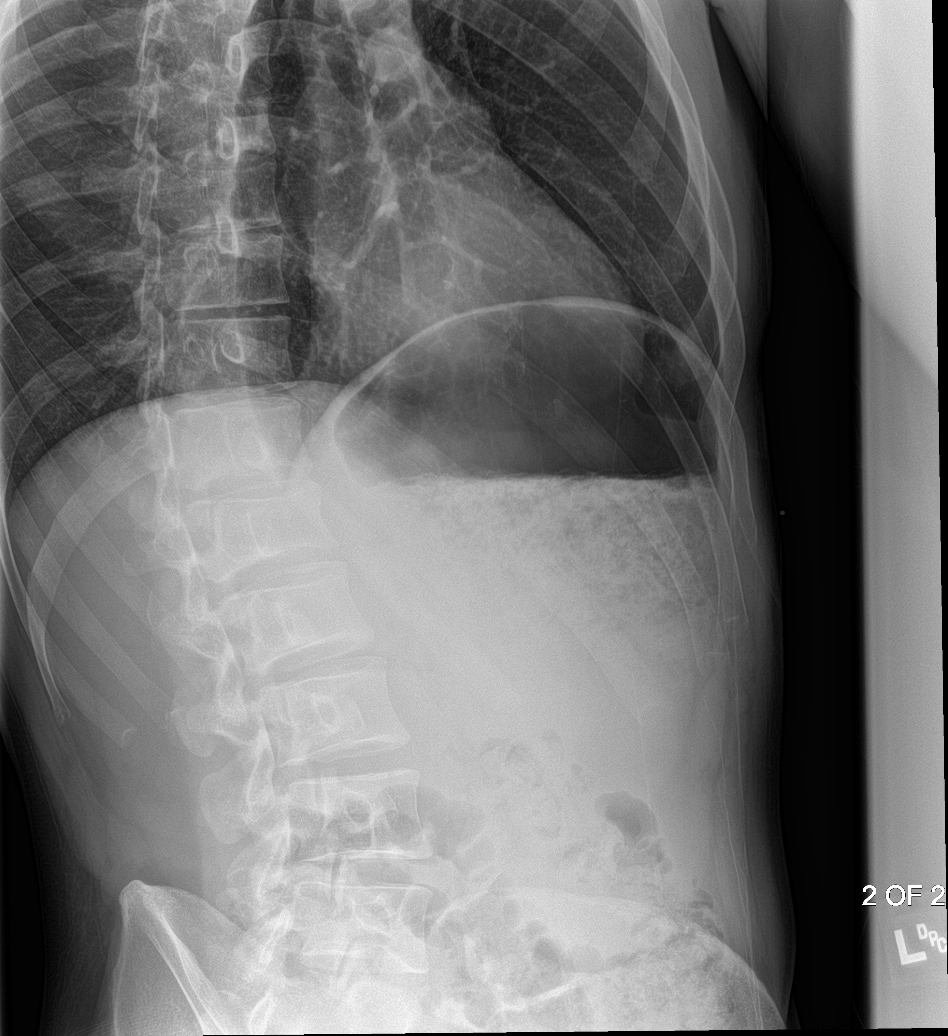

[rib pa (1 of 2)]
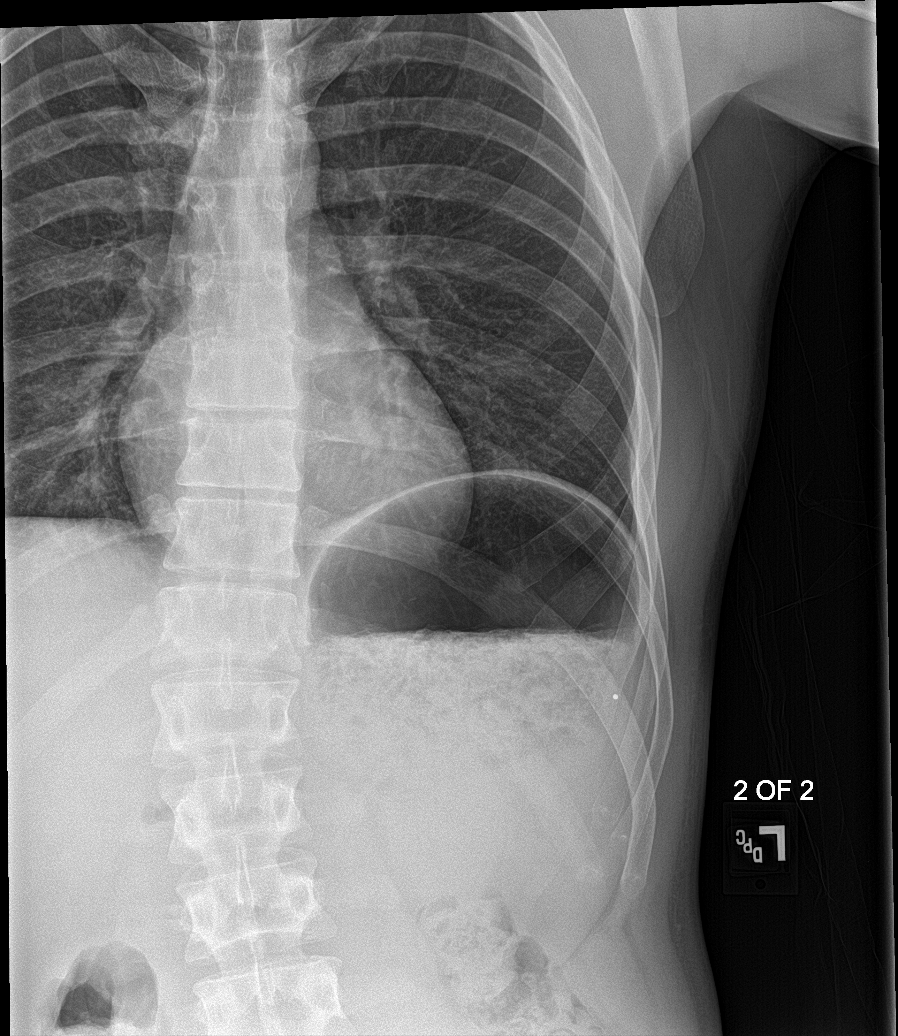

[rib pa (2 of 2)]
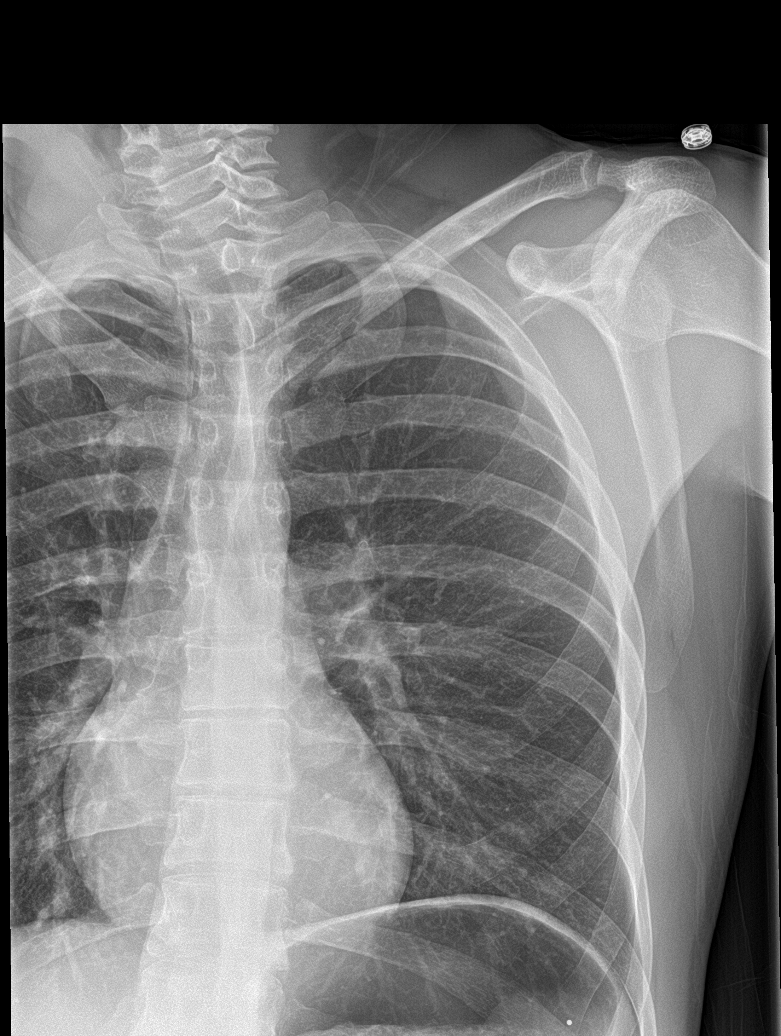

[5 of 5 positions shown; findings below may reference images not displayed]

FINDINGS: No fracture or other bone lesions are seen involving the ribs. There
is no evidence of pneumothorax or pleural effusion. Both lungs are
clear. Heart size and mediastinal contours are within normal limits.
IMPRESSION: Negative.

## 2020-07-02 ENCOUNTER — Other Ambulatory Visit: Payer: Self-pay

## 2020-07-02 ENCOUNTER — Emergency Department
Admission: EM | Admit: 2020-07-02 | Discharge: 2020-07-03 | Disposition: A | Discharge status: Home / Self Care | Payer: Medicaid Other | Attending: Emergency Medicine | Admitting: Emergency Medicine

## 2020-07-02 DIAGNOSIS — Z20822 Contact with and (suspected) exposure to covid-19: Secondary | ICD-10-CM | POA: Insufficient documentation

## 2020-07-02 DIAGNOSIS — F1910 Other psychoactive substance abuse, uncomplicated: Secondary | ICD-10-CM | POA: Diagnosis not present

## 2020-07-02 DIAGNOSIS — F17210 Nicotine dependence, cigarettes, uncomplicated: Secondary | ICD-10-CM | POA: Insufficient documentation

## 2020-07-02 LAB — COMPREHENSIVE METABOLIC PANEL
ALT: 15 U/L (ref 0–44)
AST: 18 U/L (ref 15–41)
Albumin: 4.1 g/dL (ref 3.5–5.0)
Alkaline Phosphatase: 62 U/L (ref 38–126)
Anion gap: 9 (ref 5–15)
BUN: 18 mg/dL (ref 6–20)
CO2: 21 mmol/L — ABNORMAL LOW (ref 22–32)
Calcium: 8.6 mg/dL — ABNORMAL LOW (ref 8.9–10.3)
Chloride: 107 mmol/L (ref 98–111)
Creatinine, Ser: 1.08 mg/dL (ref 0.61–1.24)
GFR, Estimated: >60 mL/min (ref >60)
Glucose, Bld: 132 mg/dL — ABNORMAL HIGH (ref 70–99)
Potassium: 3.5 mmol/L (ref 3.5–5.1)
Sodium: 137 mmol/L (ref 135–145)
Total Bilirubin: 0.8 mg/dL (ref 0.3–1.2)
Total Protein: 6.8 g/dL (ref 6.5–8.1)

## 2020-07-02 LAB — CBC
HCT: 42.4 % (ref 39.0–52.0)
Hemoglobin: 15 g/dL (ref 13.0–17.0)
MCH: 32.3 pg (ref 26.0–34.0)
MCHC: 35.4 g/dL (ref 30.0–36.0)
MCV: 91.2 fL (ref 80.0–100.0)
Platelets: 242 10*3/uL (ref 150–400)
RBC: 4.65 MIL/uL (ref 4.22–5.81)
RDW: 13.7 % (ref 11.5–15.5)
WBC: 10.6 10*3/uL — ABNORMAL HIGH (ref 4.0–10.5)
nRBC: 0 % (ref 0.0–0.2)

## 2020-07-02 LAB — ACETAMINOPHEN LEVEL: Acetaminophen (Tylenol), Serum: <10 ug/mL — ABNORMAL LOW (ref 10–30)

## 2020-07-02 LAB — SALICYLATE LEVEL: Salicylate Lvl: <7 mg/dL — ABNORMAL LOW (ref 7.0–30.0)

## 2020-07-02 LAB — ETHANOL: Alcohol, Ethyl (B): 162 mg/dL — ABNORMAL HIGH (ref <10)

## 2020-07-02 LAB — RESP PANEL BY RT-PCR (FLU A&B, COVID) ARPGX2
Influenza A by PCR: NEGATIVE
Influenza B by PCR: NEGATIVE
SARS Coronavirus 2 by RT PCR: NEGATIVE

## 2020-07-02 MED ORDER — HALOPERIDOL LACTATE 5 MG/ML IJ SOLN
10.0000 mg | Freq: Once | INTRAMUSCULAR | Status: AC
  Administered 2020-07-02: 10 mg via INTRAMUSCULAR
  Filled 2020-07-02: qty 2

## 2020-07-02 MED ORDER — LORAZEPAM 2 MG/ML IJ SOLN
2.0000 mg | Freq: Once | INTRAMUSCULAR | Status: AC
  Administered 2020-07-02: 2 mg via INTRAMUSCULAR
  Filled 2020-07-02: qty 1

## 2020-07-02 MED ORDER — DIPHENHYDRAMINE HCL 50 MG/ML IJ SOLN
50.0000 mg | Freq: Once | INTRAMUSCULAR | Status: AC
  Administered 2020-07-02: 50 mg via INTRAMUSCULAR
  Filled 2020-07-02: qty 1

## 2020-07-02 NOTE — ED Notes (Signed)
Pt yelling and pacing in room 23.  Pt cursing.  Pt reports being on drugs and needing help so he can take care of his kids.  Pt requesting meds to knock him out.  Denies Si or HI at this time.  md at bedside   meds given.

## 2020-07-02 NOTE — ED Notes (Signed)
VOL, pend eval

## 2020-07-02 NOTE — ED Notes (Signed)
Dr clapacs in with pt now.  

## 2020-07-02 NOTE — ED Notes (Signed)
Pt. Transferred to [facility] from ED to room after screening for contraband. Report to include Situation, Background, Assessment and Recommendations from RN. Pt. Oriented to unit including Q15 minute rounds as well as the security cameras for their protection. Patient is alert and oriented, warm and dry in no acute distress. Patient denies SI, HI, and AVH. Pt. Encouraged to let me know if needs arise.  

## 2020-07-02 NOTE — ED Notes (Signed)
Hourly rounding completed at this time, patient currently asleep in room. No complaints, stable, and in no acute distress. Q15 minute rounds and monitoring via Rover and Officer to continue. 

## 2020-07-02 NOTE — BH Assessment (Signed)
Comprehensive Clinical Assessment (CCA) Note  07/02/2020 David Lowe [id number]  Chief Complaint: Patient is a 30 year old male presenting to [facility] ED voluntarily seeking detox treatment. Per triage note Pt comes with c/o needing detox. Pt states he needs detox from alcohol, benzos, meth and cocaine.Pt states he just needs to get into detox somewhere and needs a ride there.Pt states he has had 6 beers, snorted gram of powder, 1/2 gram of meth, 2 suboxone earlier that were 8mg  and smoked 2 joints today.Pt now states SI and wanting to OD on heroin. Pt states I just need some help. Pt states I am hearing things and seeing things. Pt states he hasn't sleep in like 4 or 5 days.  During assessment patient appears alert and oriented x4, calm and cooperative, patient is now denying SI. Patient continues to report wanting help with detox treatment. Patient reports using "Cocaine, Alcohol, Meth, Benzos." Patient reports using Cocaine daily via inhaling "1/2 gram" and reports using today. Patient reports drinking alcohol daily "enough to start feeling good" and last drank today. Patient reports using Methamphetamines "once every 2 days" and reports using "a small bump", he reports inhaling Meth today. Patient also reports Benzo use "whatever I can get my hands on." Patient reports being clean for "7 months and I relapsed 2-3 weeks ago." Patient does not wish to report why he relapsed. Patient denies current SI/HI/AH/VH and does not appear to be responding to any internal or external stimuli.  Chief Complaint  Patient presents with  . Detox  . SI   Visit Diagnosis: Polysubstance Use Disorder   CCA Screening, Triage and Referral (STR)  Patient Reported Information How did you hear about ? Self  Referral name: No data recorded Referral phone number: No data recorded  Whom do you see for routine medical problems? I don't have a doctor  Practice/Facility Name: No data recorded Practice/Facility Phone Number:  No data recorded Name of Contact: No data recorded Contact Number: No data recorded Contact Fax Number: No data recorded Prescriber Name: No data recorded Prescriber Address (if known): No data recorded  What Is the Reason for Your Visit/Call Today? No data recorded How Long Has This Been Causing You Problems? > than 6 months  What Do You Feel Would Help You the Most Today? Alcohol or Drug Use Treatment   Have You Recently Been in Any Inpatient Treatment (Hospital/Detox/Crisis Center/28-Day Program)? No  Name/Location of Program/Hospital:No data recorded How Long Were You There? No data recorded When Were You Discharged? No data recorded  Have You Ever Received Services From [facility] Before? No  Who Do You See at [facility]? No data recorded  Have You Recently Had Any Thoughts About Hurting Yourself? No  Are You Planning to Commit Suicide/Harm Yourself At This time? No   Have you Recently Had Thoughts About Hurting Someone [facility]? No  Explanation: No data recorded  Have You Used Any Alcohol or Drugs in the Past 24 Hours? Yes  How Long Ago Did You Use Drugs or Alcohol? No data recorded What Did You Use and How Much? Methamphetamine, Alcohol, Cocaine, Benzodiazapines   Do You Currently Have a Therapist/Psychiatrist? No  Name of Therapist/Psychiatrist: No data recorded  Have You Been Recently Discharged From Any Office Practice or Programs? No  Explanation of Discharge From Practice/Program: No data recorded    CCA Screening Triage Referral Assessment Type of Contact: Face-to-Face  Is this Initial or Reassessment? No data recorded Date Telepsych consult ordered in [facility]:  No data recorded Time Telepsych consult ordered in [facility]:  No data recorded  Patient Reported Information Reviewed? Yes  Patient Left Without Being Seen? No data recorded Reason for Not Completing Assessment: No data recorded  Collateral Involvement: No data recorded  Does Patient Have a Court  Appointed Legal Guardian? No data recorded Name and Contact of Legal Guardian: self  If Minor and Not Living with Parent(s), Who has Custody? n/a  Is CPS involved or ever been involved? Never  Is APS involved or ever been involved? Never   Patient Determined To Be At Risk for Harm To Self or Others Based on Review of Patient Reported Information or Presenting Complaint? No  Method: No data recorded Availability of Means: No data recorded Intent: No data recorded Notification Required: No data recorded Additional Information for Danger to Others Potential: No data recorded Additional Comments for Danger to Others Potential: No data recorded Are There Guns or Other Weapons in Your Home? No data recorded Types of Guns/Weapons: No data recorded Are These Weapons Safely Secured?                            No data recorded Who Could Verify You Are Able To Have These Secured: No data recorded Do You Have any Outstanding Charges, Pending Court Dates, Parole/Probation? No data recorded Contacted To Inform of Risk of Harm To Self or Others: No data recorded  Location of Assessment: [facility] ED   Does Patient Present under Involuntary Commitment? No data recorded IVC Papers Initial File Date: No data recorded  [place] of Residence: [place]   Patient Currently Receiving the Following Services: No data recorded  Determination of Need: Emergent (2 hours)   Options For Referral: No data recorded    CCA Biopsychosocial Intake/Chief Complaint:  Patient is presenting to [facility] requesting detox treatment  Current Symptoms/Problems: Patient is presenting to [facility] requesting detox treatment   Patient Reported Schizophrenia/Schizoaffective Diagnosis in Past: No   Strengths: Patient is able to communicate his needs  Preferences: Unknown  Abilities: Patient is able to communicate his needs   Type of Services Patient Feels are Needed: Detox treatment   Initial Clinical Notes/Concerns:  None   Mental Health Symptoms Depression:  Irritability   Duration of Depressive symptoms: Less than two weeks   Mania:  None   Anxiety:   Worrying   Psychosis:  None   Duration of Psychotic symptoms: No data recorded  Trauma:  None   Obsessions:  None   Compulsions:  None   Inattention:  None   Hyperactivity/Impulsivity:  N/A   Oppositional/Defiant Behaviors:  None   Emotional Irregularity:  None   Other Mood/Personality Symptoms:  No data recorded   Mental Status Exam Appearance and self-care  Stature:  Average   Weight:  Average weight   Clothing:  Disheveled   Grooming:  Neglected   Cosmetic use:  None   Posture/gait:  Normal   Motor activity:  Not Remarkable   Sensorium  Attention:  Normal   Concentration:  Normal   Orientation:  X5   Recall/memory:  Normal   Affect and Mood  Affect:  Appropriate   Mood:  Depressed   Relating  Eye contact:  Normal   Facial expression:  Responsive   Attitude toward examiner:  Cooperative   Thought and Language  Speech flow: Clear and Coherent   Thought content:  Appropriate to Mood and Circumstances   Preoccupation:  None  Hallucinations:  None   Organization:  No data recorded  Affiliated Computer ServicesExecutive Functions  Fund of Knowledge:  Fair   Intelligence:  Average   Abstraction:  Concrete   Judgement:  Fair   Reality Testing:  Adequate   Insight:  Fair   Decision Making:  Normal   Social Functioning  Social Maturity:  Irresponsible   Social Judgement:  Normal   Stress  Stressors:  Other (Comment)   Coping Ability:  Exhausted   Skill Deficits:  None   Supports:  Support needed     Religion: Religion/Spirituality Are You A Religious Person?: No  Leisure/Recreation: Leisure / Recreation Do You Have Hobbies?: No  Exercise/Diet: Exercise/Diet Do You Exercise?: No Have You Gained or Lost A Significant Amount of Weight in the Past Six Months?: No Do You Follow a Special Diet?:  No Do You Have Any Trouble Sleeping?: No   CCA Employment/Education Employment/Work Situation: Employment / Work Situation Employment situation: Employed Where is patient currently employed?: Unknown How long has patient been employed?: Unknown Patient's job has been impacted by current illness:  (Unknown) What is the longest time patient has a held a job?: Unknown Where was the patient employed at that time?: Unknown Has patient ever been in the [employer] and Companymilitary?: No  Education: Education Is Patient Currently Attending School?: No Did Garment/textile technologistYou Graduate From [employer] School?: Yes Did Theme park managerYou Attend College?: No Did Designer, television/film setYou Attend Graduate School?: No Did You Have An Individualized Education Program (IIEP): No Did You Have Any Difficulty At Progress EnergySchool?: No Patient's Education Has Been Impacted by Current Illness: No   CCA Family/Childhood History Family and Relationship History: Family history Marital status: Single Are you sexually active?: No What is your sexual orientation?: Hetereosexual Has your sexual activity been affected by drugs, alcohol, medication, or emotional stress?: Unknown Does patient have children?: Yes How many children?: 2 How is patient's relationship with their children?: Patient reports relationghip with children are good  Childhood History:  Childhood History By whom was/is the patient raised?: Other (Comment) Additional childhood history information: None reproted Description of patient's relationship with caregiver when they were a child: None reported Patient's description of current relationship with people who raised him/her: None reported How were you disciplined when you got in trouble as a child/adolescent?: None reported Does patient have siblings?: No Did patient suffer any verbal/emotional/physical/sexual abuse as a child?: No Did patient suffer from severe childhood neglect?: No Has patient ever been sexually abused/assaulted/raped as an adolescent or adult?:  No Was the patient ever a victim of a crime or a disaster?: No Witnessed domestic violence?: No Has patient been affected by domestic violence as an adult?: No  Child/Adolescent Assessment:     CCA Substance Use Alcohol/Drug Use: Alcohol / Drug Use Pain Medications: See MAR Prescriptions: See MAR Over the Counter: See MAR History of alcohol / drug use?: Yes Withdrawal Symptoms: Agitation Substance #1 Name of Substance 1: Cocaine 1 - Amount (size/oz): "1/2 gram" 1 - Frequency: daily 1 - Last Use / Amount: 07/02/20 1- Route of Use: Inhaling Substance #2 Name of Substance 2: Alcohol 2 - Amount (size/oz): "Enough to start feeling good" 2 - Frequency: daily 2 - Last Use / Amount: 07/02/20 2 - Route of Substance Use: oral Substance #3 Name of Substance 3: Methamphetamine 3 - Amount (size/oz): "small bump" 3 - Frequency: "every 2 days" 3 - Last Use / Amount: 07/02/20 3 - Route of Substance Use: Inhaling Substance #4 Name of Substance 4: Benzodiazapines 4 - Amount (size/oz): "  whatever I can get my hands on" 4 - Frequency: Unknown 4 - Duration: Unknown 4 - Last Use / Amount: 07/02/20 4 - Route of Substance Use: Oral                 ASAM's:  Six Dimensions of Multidimensional Assessment  Dimension 1:  Acute Intoxication and/or Withdrawal Potential:      Dimension 2:  Biomedical Conditions and Complications:      Dimension 3:  Emotional, Behavioral, or Cognitive Conditions and Complications:     Dimension 4:  Readiness to Change:     Dimension 5:  Relapse, Continued use, or Continued Problem Potential:     Dimension 6:  Recovery/Living Environment:     ASAM Severity Score:    ASAM Recommended Level of Treatment:     Substance use Disorder (SUD) Substance Use Disorder (SUD)  Checklist Symptoms of Substance Use: Continued use despite having a persistent/recurrent physical/psychological problem caused/exacerbated by use,Evidence of tolerance,Evidence of withdrawal  (Comment),Presence of craving or strong urge to use,Social, occupational, recreational activities given up or reduced due to use,Large amounts of time spent to obtain, use or recover from the substance(s),Recurrent use that results in a failure to fulfill major role obligations (work, school, home),Substance(s) often taken in larger amounts or over longer times than was intended,Continued use despite persistent or recurrent social, interpersonal problems, caused or exacerbated by use,Persistent desire or unsuccessful efforts to cut down or control use,Repeated use in physically hazardous situations  Recommendations for Services/Supports/Treatments: Recommendations for Services/Supports/Treatments Recommendations For Services/Supports/Treatments: Detox  DSM5 Diagnoses: Patient Active Problem List   Diagnosis Date Noted  . Substance abuse (HCC) 12/27/2017  . 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 Alcohol dependence with alcohol-induced mood disorder (HCC)   . Benzodiazepine abuse (HCC)   . Unspecified depressive disorder 03/19/2016  . Cocaine use disorder, severe, dependence (HCC) 03/19/2016  . Opioid use disorder, moderate, dependence (HCC) 03/19/2016  . Cannabis use disorder, severe, dependence (HCC) 03/19/2016  . Tobacco use disorder 03/19/2016  . 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 Benzodiazepine withdrawal (HCC) 03/19/2016    Patient Centered Plan: Patient is on the following Treatment Plan(s):  Substance Abuse   Referrals to Alternative Service(s): Referred to Alternative Service(s):   Place:   Date:   Time:    Referred to Alternative Service(s):   Place:   Date:   Time:    Referred to Alternative Service(s):   Place:   Date:   Time:    Referred to Alternative Service(s):   Place:   Date:   Time:     Kately Graffam A Jemery Stacey, LCAS-A

## 2020-07-02 NOTE — ED Notes (Signed)
Pt sleeping. 

## 2020-07-02 NOTE — ED Notes (Addendum)
Pt moved to room 23.  Door closed.  Pt screaming, cursing at officer at door.  Stating "I want haldol, I want Ativan, I want Valium, I want Toradol".  Lights dimmed.  Dr. Franco Collet at bedside.

## 2020-07-02 NOTE — ED Notes (Signed)
Report received from Amy, RN. Patient currently sleeping, respirations regular and unlabored. Q15 minute rounds and observation by Rover and Officer to continue. Will assess patient once awake. 

## 2020-07-02 NOTE — ED Notes (Signed)
Pt was found sitting in the floor.  Pt wet his bed.  Pt denies fall.  Pt denies any pain.  Charge nurse heather rn aware.

## 2020-07-02 NOTE — ED Provider Notes (Signed)
[facility] Emergency Department Provider Note ____________________________________________   Event Date/Time   First MD Initiated Contact with Patient 07/02/20 1607     (approximate)  I have reviewed the triage vital signs and the nursing notes.   HISTORY  Chief Complaint Detox and SI  Level 5 caveat: History present illness limited due to intoxication and disorganized historian  HPI David Lowe is a 30 y.o. male with PMH as noted below including polysubstance abuse who presents due to substance abuse.  The patient states "I'm high" and that he needs help.  He states that he wants to be sedated for "1 or 2 days" until he is sober and can go try to find housing.  He denies any acute medical complaints or any SI or HI.  Past Medical History:  Diagnosis Date  . Anxiety attack   . MDD (major depressive disorder)     Patient Active Problem List   Diagnosis Date Noted  . Substance abuse (HCC) 12/27/2017  . 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 Alcohol dependence with alcohol-induced mood disorder (HCC)   . Benzodiazepine abuse (HCC)   . Unspecified depressive disorder 03/19/2016  . Cocaine use disorder, severe, dependence (HCC) 03/19/2016  . Opioid use disorder, moderate, dependence (HCC) 03/19/2016  . Cannabis use disorder, severe, dependence (HCC) 03/19/2016  . Tobacco use disorder 03/19/2016  . 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 Benzodiazepine withdrawal (HCC) 03/19/2016    History reviewed. No pertinent surgical history.  Prior to Admission medications   Medication Sig Start Date End Date Taking? Authorizing Provider  buPROPion (WELLBUTRIN SR) 150 MG 12 hr tablet Take 300 mg by mouth daily. Patient not taking: No sig reported 04/21/20   [provider]  doxepin (SINEQUAN) 100 MG capsule Take 100 mg by mouth at bedtime. Patient not taking: No sig reported 05/21/20    [provider]    Allergies Bactericin [bacitracin], Keflet [cephalexin], Seroquel [quetiapine], Trazodone, and Zyrtec [cetirizine]  No family history on file.  Social History Social History   Tobacco Use  . Smoking status: Current Every Day Smoker    Packs/day: 0.50    Types: Cigarettes  . Smokeless tobacco: Never Used  . Tobacco comment: declined  Vaping Use  . Vaping Use: Never used  Substance Use Topics  . Alcohol use: Yes    Comment: last use today,  6 beers  . Drug use: Yes    Types: Marijuana, Cocaine, Benzodiazepines, Methamphetamines    Comment: today    Review of Systems Level 5 caveat: Review of systems limited due to intoxication and disorganized historian  Cardiovascular: Denies chest pain. Respiratory: Denies shortness of breath. Gastrointestinal: No vomiting. Musculoskeletal: Negative for back pain. Skin: Negative for rash. Neurological: Negative for headache.   ____________________________________________   PHYSICAL EXAM:  VITAL SIGNS: ED Triage Vitals  Enc Vitals Group     BP 07/02/20 1539 (!) 139/92     Pulse Rate 07/02/20 1539 (!) 102     Resp 07/02/20 1539 18     Temp 07/02/20 1539 98.4 F (36.9 C)     Temp src --      SpO2 07/02/20 1539 100 %     Weight 07/02/20 1531 160 lb (72.6 kg)     Height 07/02/20 1531 5\' 10"  (1.778 m)     Head Circumference --      Peak Flow --      Pain Score 07/02/20 1531 0  Pain Loc --      Pain Edu? --      Excl. in GC? --     Constitutional: Alert and oriented.  Somewhat anxious and agitated but in no acute distress. Eyes: Conjunctivae are normal.  EOMI. Head: Atraumatic. Nose: No congestion/rhinnorhea. Mouth/Throat: Mucous membranes are moist.   Neck: Normal range of motion.  Cardiovascular: Good peripheral circulation. Respiratory: Normal respiratory effort.  No retractions.  Gastrointestinal: No distention.  Musculoskeletal: Extremities warm and well perfused.  Neurologic:   Normal speech and language.  Steady gait.  No gross focal neurologic deficits are appreciated.  Skin:  Skin is warm and dry. No rash noted. Psychiatric: Somewhat anxious and angry appearing, agitated.    ____________________________________________   LABS (all labs ordered are listed, but only abnormal results are displayed)  Labs Reviewed  COMPREHENSIVE METABOLIC PANEL - Abnormal; Notable for the following components:      Result Value   CO2 21 (*)    Glucose, Bld 132 (*)    Calcium 8.6 (*)    All other components within normal limits  ETHANOL - Abnormal; Notable for the following components:   Alcohol, Ethyl (B) 162 (*)    All other components within normal limits  CBC - Abnormal; Notable for the following components:   WBC 10.6 (*)    All other components within normal limits  SALICYLATE LEVEL - Abnormal; Notable for the following components:   Salicylate Lvl <7.0 (*)    All other components within normal limits  ACETAMINOPHEN LEVEL - Abnormal; Notable for the following components:   Acetaminophen (Tylenol), Serum <10 (*)    All other components within normal limits  RESP PANEL BY RT-PCR (FLU A&B, COVID) ARPGX2  URINE DRUG SCREEN, QUALITATIVE ([facility] ONLY)   ____________________________________________  EKG   ____________________________________________  RADIOLOGY    ____________________________________________   PROCEDURES  Procedure(s) performed: No  Procedures  Critical Care performed: No ____________________________________________   INITIAL IMPRESSION / ASSESSMENT AND PLAN / ED COURSE  Pertinent labs & imaging results that were available during my care of the patient were reviewed by me and considered in my medical decision making (see chart for details).  30 year old male with PMH as noted above including history of polysubstance abuse presents stating he wants help due to substance abuse.  He reports drinking alcohol and using multiple drugs and  wants to be knocked out for "1 to 2 days" until he can get himself together.  He denies any SI or HI.  On arrival to the ED, the patient was agitated, pacing around the room, yelling but not doing anything to hurt himself or threaten others.  He was verbally redirectable.  Physical exam is unremarkable.  Vital signs are normal except for borderline tachycardia.  At this time, the patient does not demonstrate an acute danger to self or others and does not meet IVC criteria.  Due to his intoxication and agitation we will give IM Ativan.  Plan will be to observe for sobriety then reassess any need for emergent psychiatry evaluation or detox referral.  ----------------------------------------- 10:51 PM on 07/02/2020 -----------------------------------------  The patient has been sleeping comfortably and has been moved to the BHU.  Plan will be to reassess for sobriety in the morning.  I will sign him out to the oncoming ED physician at 11 PM.  _____________________________  The patient has been placed in psychiatric observation due to the need to provide a safe environment for the patient while obtaining psychiatric consultation and evaluation,  as well as ongoing medical and medication management to treat the patient's condition.  The patient has not been placed under full IVC at this time.  ____________________________________________   FINAL CLINICAL IMPRESSION(S) / ED DIAGNOSES  Final diagnoses:  Polysubstance abuse (HCC)      NEW MEDICATIONS STARTED DURING THIS VISIT:  New Prescriptions   No medications on file     Note:  This document was prepared using Dragon voice recognition software and may include unintentional dictation errors.    Dionne Bucy, MD 07/02/20 2252

## 2020-07-02 NOTE — ED Triage Notes (Addendum)
Pt comes with c/o needing detox. Pt states he needs detox from alcohol, benzos, meth and cocaine.  Pt states he just needs to get into detox somewhere and needs a ride there.  Pt states he has had 6 beers, snorted gram of powder, 1/2 gram of meth, 2 suboxone earlier that were 8mg  and smoked 2 joints today.  Pt now states SI and wanting to OD on heroin. Pt states I just need some help. Pt states I am hearing things and seeing things. Pt states he hasn't sleep in like 4 or 5 days.

## 2020-07-02 NOTE — ED Notes (Signed)
meds given again.  Pt continues to yell out.  Pt ate dinner tray

## 2020-07-02 NOTE — ED Notes (Signed)
Pt asked for another sandwich tray.  Pt ate 100% and is now sleeping

## 2020-07-02 NOTE — ED Notes (Addendum)
Pt dressed out in hospital attrire. Pt's belongings to include: 1 black shirt 1 pair of jeans 2 gray shoes 1 blue hat 2 white socks 1 black bag 1 brown belt 1 green lighter 1 black wallet 1 black underwear

## 2020-07-02 NOTE — ED Notes (Signed)
Pt asleep in dry bed.  Bedding changed and clothes changed.  Pt needs ua.

## 2020-07-03 MED ORDER — ACETAMINOPHEN 500 MG PO TABS
1000.0000 mg | ORAL_TABLET | Freq: Four times a day (QID) | ORAL | Status: DC | PRN
  Administered 2020-07-03: 1000 mg via ORAL
  Filled 2020-07-03: qty 2

## 2020-07-03 MED ORDER — LORAZEPAM 1 MG PO TABS
1.0000 mg | ORAL_TABLET | ORAL | Status: AC
  Administered 2020-07-03: 1 mg via ORAL
  Filled 2020-07-03: qty 1

## 2020-07-03 NOTE — ED Notes (Signed)
Hourly rounding reveals patient in room. No complaints, stable, in no acute distress. Q15 minute rounds and monitoring via Security Cameras to continue. 

## 2020-07-03 NOTE — ED Provider Notes (Signed)
Procedures     ----------------------------------------- 4:57 PM on 07/03/2020 -----------------------------------------  Patient is here voluntarily requesting detox.  However, he now wishes to be discharged and no longer wants to wait for assistance with detox placement.  No SI or HI currently, no hallucinations.  He has medical decision making capacity, not a danger to himself or others and stable for discharge.    Sharman Cheek, MD 07/03/20 [id number]

## 2020-07-03 NOTE — ED Provider Notes (Signed)
Emergency Medicine Observation Re-evaluation Note  David Lowe is a 30 y.o. male, seen on rounds today.  Pt initially presented to the ED for complaints of Detox and SI Currently, the patient is calm, resting calmly, denies any acute complaint.  Physical Exam  BP (!) 139/92   Pulse (!) 102   Temp 98.4 F (36.9 C)   Resp 18   Ht 5\' 10"  (1.778 m)   Wt 72.6 kg   SpO2 100%   BMI 22.96 kg/m  Physical Exam General: calm Cardiac: normal rate Lungs: equal chest rise Psych: calm   ED Course / MDM  EKG:    I have reviewed the labs performed to date as well as medications administered while in observation.  Recent changes in the last 24 hours include none.  Plan  Current plan is for social work to dispo for rehab. Patient is not under full IVC at this time.   , MD 07/03/20 [phone]

## 2020-07-03 NOTE — BH Assessment (Addendum)
Referral Check:   [facility] [phone]) Alleta request pt to call in to complete screening for detox [phone]) and plans to follow up after screening is completed   [facility] [phone]) Mohhamd reports to be at capacity and requested re-fax for review pending any discharges

## 2020-07-03 NOTE — BH Assessment (Addendum)
Referral information for Detox treatment faxed to;   . [facility] [phone])  . [facility] [phone]) Will reports referral information will be passed along to day shift nurse for review, 1 discharge is scheduled for this afternoon and to follow-up later this morning

## 2020-07-03 NOTE — ED Notes (Signed)
Patient requesting to leave.  Writer notified EDP Dr. Scotty Court.

## 2020-07-03 NOTE — BH Assessment (Signed)
TTS followed up with Al[place]ta ([facility] [phone] [id number]) who reports to be unable to accept pt until the out of state medicaid is canceled. Alleta requested for pt to cancel the out of state medicaid and provide documention of the cancellation in order for pt to be reconsidered at this time.

## 2020-07-03 NOTE — BH Assessment (Signed)
Late entry- Pt completed pre-screening with Alleta ([facility]) AT 11am. Alleta agreed to have pt reviewed and make contact when a decision is made.

## 2020-07-08 ENCOUNTER — Other Ambulatory Visit: Payer: Self-pay

## 2020-07-08 ENCOUNTER — Emergency Department
Admission: EM | Admit: 2020-07-08 | Discharge: 2020-07-08 | Disposition: A | Discharge status: Home / Self Care | Payer: Medicaid Other | Attending: Student in an Organized Health Care Education/Training Program | Admitting: Student in an Organized Health Care Education/Training Program

## 2020-07-08 ENCOUNTER — Encounter: Payer: Self-pay | Admitting: Emergency Medicine

## 2020-07-08 DIAGNOSIS — F17210 Nicotine dependence, cigarettes, uncomplicated: Secondary | ICD-10-CM | POA: Insufficient documentation

## 2020-07-08 DIAGNOSIS — K0889 Other specified disorders of teeth and supporting structures: Secondary | ICD-10-CM

## 2020-07-08 MED ORDER — AMOXICILLIN 875 MG PO TABS: 875.0000 mg | ORAL_TABLET | Freq: Two times a day (BID) | ORAL | 0 refills | Status: DC

## 2020-07-08 MED ORDER — IBUPROFEN 600 MG PO TABS: 600.0000 mg | ORAL_TABLET | Freq: Four times a day (QID) | ORAL | 0 refills | Status: DC | PRN

## 2020-07-08 NOTE — Discharge Instructions (Addendum)
Take antibiotics until completely finished.  Take all of antibiotics until completely finished.  Ibuprofen 600 mg every 6 hours with food if needed for pain and for inflammation.  You may call your dentist in [place] to see if you can get an appointment.  Or you may call the clinics listed on your discharge papers to see a dentist.  Also the clinic at [facility] takes while can patients.  OPTIONS FOR DENTAL FOLLOW UP CARE  [place] [facility] - Local Safety Net Dental Clinics [email or web address]   [facility] [phone])  [facility] [phone])  [facility] [phone])  [facility] [phone])  [facility] [phone]) This clinic caters to the indigent population and is on a lottery system. Location: [employer] [facility], [employer], [address] [place] Clinic Hours: Wednesdays from 6pm - 9pm, patients seen by a lottery system. For dates, call or go to [email or web address] Services: Cleanings, fillings and simple extractions. Payment Options: DENTAL WORK IS FREE OF CHARGE. Bring proof of income or support. Best way to get seen: Arrive at 5:15 pm - this is a lottery, NOT first come/first serve, so arriving earlier will not increase your chances of being seen.     [facility] [phone] Select option 1 for emergencies   Location: [facility], [place], [address] [place] Clinic Hours: No walk-ins accepted - call the day before to schedule an appointment. Check in times are 9:30 am and 1:30 pm. Services: Simple extractions, temporary fillings, pulpectomy/pulp debridement, uncomplicated abscess drainage. Payment Options: PAYMENT IS DUE AT THE TIME OF SERVICE.  Fee is usually $100-200, additional surgical procedures (e.g. abscess  drainage) may be extra. Cash, checks, Visa/MasterCard accepted.  Can file Medicaid if patient is covered for dental - patient should call case worker to check. No discount for [facility] patients. Best way to get seen: MUST call the day before and get onto the schedule. Can usually be seen the next 1-2 days. No walk-ins accepted.     [facility] [phone]   Location: [facility], [address], [place] Clinic Hours: M, W, Th, F 8am or 1:30pm, Tues 9a or 1:30 - first come/first served. Services: Simple extractions, temporary fillings, uncomplicated abscess drainage.  You do not need to be an [facility] resident. Payment Options: PAYMENT IS DUE AT THE TIME OF SERVICE. Dental insurance, otherwise sliding scale - bring proof of income or support. Depending on income and treatment needed, cost is usually $50-200. Best way to get seen: Arrive early as it is first come/first served.     [facility] [phone]   Location: [address] Clinic Hours: Mon-Thu 8a-5p Services: Most basic dental services including extractions and fillings. Payment Options: PAYMENT IS DUE AT THE TIME OF SERVICE. Sliding scale, up to 50% off - bring proof if income or support. Medicaid with dental option accepted. Best way to get seen: Call to schedule an appointment, can usually be seen within 2 weeks OR they will try to see walk-ins - show up at 8a or 2p (you may have to wait).     [facility] [phone] [place] RESIDENTS ONLY   Location: [facility], [address], [place], [place] [address] Clinic Hours: By appointment only. Monday - Thursday 8am-5pm, Friday 8am-12pm Services: Cleanings, fillings, extractions. Payment Options: PAYMENT IS DUE  AT THE TIME OF SERVICE. Cash, Visa or MasterCard. Sliding scale - $30 minimum per service. Best way to get  seen: Come in to office, complete packet and make an appointment - need proof of income or support monies for each household member and proof of [facility] residence. Usually takes about a month to get in.     [facility] [phone]   Location: [address] [facility] Clinic Hours: Walk-in Urgent Care Dental Services are offered Monday-Friday mornings only. The numbers of emergencies accepted daily is limited to the number of providers available. Maximum 15 - Mondays, Wednesdays & Thursdays Maximum 10 - Tuesdays & Fridays Services: You do not need to be a [facility] resident to be seen for a dental emergency. Emergencies are defined as pain, swelling, abnormal bleeding, or dental trauma. Walkins will receive x-rays if needed. NOTE: Dental cleaning is not an emergency. Payment Options: PAYMENT IS DUE AT THE TIME OF SERVICE. Minimum co-pay is $40.00 for uninsured patients. Minimum co-pay is $3.00 for Medicaid with dental coverage. Dental Insurance is accepted and must be presented at time of visit. Medicare does not cover dental. Forms of payment: Cash, credit card, checks. Best way to get seen: If not previously registered with the clinic, walk-in dental registration begins at 7:15 am and is on a first come/first serve basis. If previously registered with the clinic, call to make an appointment.     [facility] [phone] [place] RESIDENTS ONLY   Location: [address] [place], [place] Clinic Hours: Mon-Thu 10a-2p Services: Extractions only! Payment Options: FREE (donations accepted) - bring proof of income or support Best way to get seen: Call and schedule an appointment OR come at 8am on the 1st Monday of every month (except for holidays) when it is first come/first served.     [facility] [phone]   Location: [address] [place], [place] Clinic Hours: Friday mornings Services, Payment Options, Best  way to get seen: Call for info

## 2020-07-08 NOTE — ED Triage Notes (Signed)
PT to ER with c/o 2-3 days of dental pain.  Pt states area is beginning to swell and is red.  NAD noted at this time.

## 2020-07-08 NOTE — ED Provider Notes (Signed)
[facility] Emergency Department Provider Note  ____________________________________________   Event Date/Time   First MD Initiated Contact with Patient 07/08/20 [phone]     (approximate)  I have reviewed the triage vital signs and the nursing notes.   HISTORY  Chief Complaint Dental Pain   HPI David Lowe is a 30 y.o. male presents to the ED with complaint of dental pain for the last 2 to 3 days.  Patient states that this morning it has begun to swell and gums appear to be red.  Patient currently does not have a dentist appointment.  He rates his pain as 5 out of 10.      Past Medical History:  Diagnosis Date  . Anxiety attack   . MDD (major depressive disorder)     Patient Active Problem List   Diagnosis Date Noted  . Substance abuse (HCC) 12/27/2017  . 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 Alcohol dependence with alcohol-induced mood disorder (HCC)   . Benzodiazepine abuse (HCC)   . Unspecified depressive disorder 03/19/2016  . Cocaine use disorder, severe, dependence (HCC) 03/19/2016  . Opioid use disorder, moderate, dependence (HCC) 03/19/2016  . Cannabis use disorder, severe, dependence (HCC) 03/19/2016  . Tobacco use disorder 03/19/2016  . 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 Benzodiazepine withdrawal (HCC) 03/19/2016    History reviewed. No pertinent surgical history.  Prior to Admission medications   Medication Sig Start Date End Date Taking? Authorizing Provider  amoxicillin (AMOXIL) 875 MG tablet Take 1 tablet (875 mg total) by mouth 2 (two) times daily. 07/08/20  Yes Bridget Hartshorn L, PA-C  ibuprofen (ADVIL) 600 MG tablet Take 1 tablet (600 mg total) by mouth every 6 (six) hours as needed. 07/08/20  Yes Tommi Rumps, PA-C  buPROPion (WELLBUTRIN SR) 150 MG 12 hr tablet Take 300 mg by mouth daily. Patient not taking: No sig reported 04/21/20   [provider]  doxepin (SINEQUAN) 100 MG capsule Take 100 mg by mouth at bedtime. Patient not taking: No sig reported 05/21/20   [provider]    Allergies Bactericin [bacitracin], Keflet [cephalexin], Seroquel [quetiapine], Trazodone, and Zyrtec [cetirizine]  History reviewed. No pertinent family history.  Social History Social History   Tobacco Use  . Smoking status: Current Every Day Smoker    Packs/day: 0.50    Types: Cigarettes  . Smokeless tobacco: Never Used  . Tobacco comment: declined  Vaping Use  . Vaping Use: Never used  Substance Use Topics  . Alcohol use: Yes    Comment: last use today,  6 beers  . Drug use: Yes    Types: Marijuana, Cocaine, Benzodiazepines, Methamphetamines    Comment: today    Review of Systems Constitutional: No fever/chills Eyes: No visual changes. ENT: Positive dental pain. Cardiovascular: Denies chest pain. Respiratory: Denies shortness of breath. Gastrointestinal: No abdominal pain.  No nausea, no vomiting. Musculoskeletal: Negative for musculoskeletal pain. Skin: Negative for rash. Neurological: Negative for headaches, focal weakness or numbness. Psychiatric: Major depressive disorder, polysubstance abuse. ____________________________________________   PHYSICAL EXAM:  VITAL SIGNS: ED Triage Vitals  Enc Vitals Group     BP 07/08/20 0737 132/82     Pulse Rate 07/08/20 0737 75     Resp 07/08/20 0737 16     Temp 07/08/20 0737 98 F (36.7 C)     Temp Source 07/08/20 0737 Oral     SpO2 07/08/20 0737 99 %  Weight 07/08/20 0738 160 lb (72.6 kg)     Height 07/08/20 0738 5\' 10"  (1.778 m)     Head Circumference --      Peak Flow --      Pain Score 07/08/20 0738 5     Pain Loc --      Pain Edu? --      Excl. in [place]? --     Constitutional: Alert and oriented. Well appearing and in no acute distress. Eyes: Conjunctivae are normal. Head: Atraumatic. Nose: No congestion/rhinnorhea. Mouth/Throat: Right lower molar  with cavity posteriorly.  Gums are tender and mildly edematous.  No active drainage is noted. Neck: No stridor.   Cardiovascular: Normal rate, regular rhythm. Grossly normal heart sounds.  Good peripheral circulation. Respiratory: Normal respiratory effort.  No retractions. Lungs CTAB. Musculoskeletal: Moves upper and lower extremities with any difficulty.  Normal gait was noted. Neurologic:  Normal speech and language. No gross focal neurologic deficits are appreciated. No gait instability. Skin:  Skin is warm, dry and intact. No rash noted. Psychiatric: Mood and affect are normal. Speech and behavior are normal.  ____________________________________________   LABS (all labs ordered are listed, but only abnormal results are displayed)  Labs Reviewed - No data to display  PROCEDURES  Procedure(s) performed (including Critical Care):  Procedures   ____________________________________________   INITIAL IMPRESSION / ASSESSMENT AND PLAN / ED COURSE  As part of my medical decision making, I reviewed the following data within the electronic MEDICAL RECORD NUMBER Notes from prior ED visits and [place] Controlled Substance Database  30 year old male presents to the ED with complaint of dental pain that started approximately 2 to 3 days ago.  Patient noticed area around the tooth began to swell.  Patient has a cavity on the posterior aspect of the right lower tooth that is involved.  No active drainage is noted.  Gums are inflamed in that area.  Patient is able to take amoxicillin despite the allergy to cephalexin.  A prescription for amoxicillin and ibuprofen was sent to his pharmacy.  Patient states he has a 26 in [place]  but a list of dental clinics is listed on his discharge papers. ____________________________________________   FINAL CLINICAL IMPRESSION(S) / ED DIAGNOSES  Final diagnoses:  Pain, dental     ED Discharge Orders         Ordered    amoxicillin (AMOXIL) 875 MG tablet  2  times daily        07/08/20 0749    ibuprofen (ADVIL) 600 MG tablet  Every 6 hours PRN        07/08/20 0749          *Please note:  David Lowe was evaluated in Emergency Department on 07/08/2020 for the symptoms described in the history of present illness. He was evaluated in the context of the global COVID-19 pandemic, which necessitated consideration that the patient might be at risk for infection with the SARS-CoV-2 virus that causes COVID-19. Institutional protocols and algorithms that pertain to the evaluation of patients at risk for COVID-19 are in a state of rapid change based on information released by regulatory bodies including the CDC and federal and state organizations. These policies and algorithms were followed during the patient's care in the ED.  Some ED evaluations and interventions may be delayed as a result of limited staffing during and the pandemic.*   Note:  This document was prepared using Dragon voice recognition software and may include unintentional dictation errors.  Tommi Rumps, PA-C 07/08/20 1040    Willy Eddy, MD 07/08/20 [phone]

## 2021-05-06 ENCOUNTER — Other Ambulatory Visit: Payer: Self-pay

## 2021-05-06 ENCOUNTER — Encounter: Payer: Self-pay | Admitting: *Deleted

## 2021-05-06 ENCOUNTER — Emergency Department
Admission: EM | Admit: 2021-05-06 | Discharge: 2021-05-07 | Disposition: A | Discharge status: Home / Self Care | Payer: Medicaid Other | Attending: Emergency Medicine | Admitting: Emergency Medicine

## 2021-05-06 DIAGNOSIS — F17200 Nicotine dependence, unspecified, uncomplicated: Secondary | ICD-10-CM | POA: Diagnosis present

## 2021-05-06 DIAGNOSIS — F1910 Other psychoactive substance abuse, uncomplicated: Secondary | ICD-10-CM | POA: Diagnosis present

## 2021-05-06 DIAGNOSIS — F1024 Alcohol dependence with alcohol-induced mood disorder: Secondary | ICD-10-CM | POA: Insufficient documentation

## 2021-05-06 DIAGNOSIS — F13939 Sedative, hypnotic or anxiolytic use, unspecified with withdrawal, unspecified: Secondary | ICD-10-CM | POA: Diagnosis present

## 2021-05-06 DIAGNOSIS — F12280 Cannabis dependence with cannabis-induced anxiety disorder: Secondary | ICD-10-CM | POA: Insufficient documentation

## 2021-05-06 DIAGNOSIS — F13239 Sedative, hypnotic or anxiolytic dependence with withdrawal, unspecified: Secondary | ICD-10-CM | POA: Insufficient documentation

## 2021-05-06 DIAGNOSIS — R45851 Suicidal ideations: Secondary | ICD-10-CM | POA: Insufficient documentation

## 2021-05-06 DIAGNOSIS — F332 Major depressive disorder, recurrent severe without psychotic features: Secondary | ICD-10-CM | POA: Insufficient documentation

## 2021-05-06 DIAGNOSIS — F1010 Alcohol abuse, uncomplicated: Secondary | ICD-10-CM

## 2021-05-06 DIAGNOSIS — F1120 Opioid dependence, uncomplicated: Secondary | ICD-10-CM | POA: Diagnosis present

## 2021-05-06 DIAGNOSIS — F1220 Cannabis dependence, uncomplicated: Secondary | ICD-10-CM | POA: Diagnosis present

## 2021-05-06 DIAGNOSIS — F1124 Opioid dependence with opioid-induced mood disorder: Secondary | ICD-10-CM | POA: Diagnosis not present

## 2021-05-06 DIAGNOSIS — F1320 Sedative, hypnotic or anxiolytic dependence, uncomplicated: Secondary | ICD-10-CM | POA: Diagnosis not present

## 2021-05-06 DIAGNOSIS — F333 Major depressive disorder, recurrent, severe with psychotic symptoms: Secondary | ICD-10-CM | POA: Diagnosis present

## 2021-05-06 DIAGNOSIS — Z20822 Contact with and (suspected) exposure to covid-19: Secondary | ICD-10-CM | POA: Insufficient documentation

## 2021-05-06 DIAGNOSIS — F22 Delusional disorders: Secondary | ICD-10-CM | POA: Diagnosis not present

## 2021-05-06 DIAGNOSIS — Y903 Blood alcohol level of 60-79 mg/100 ml: Secondary | ICD-10-CM | POA: Diagnosis not present

## 2021-05-06 DIAGNOSIS — F1310 Sedative, hypnotic or anxiolytic abuse, uncomplicated: Secondary | ICD-10-CM | POA: Diagnosis present

## 2021-05-06 DIAGNOSIS — Z79899 Other long term (current) drug therapy: Secondary | ICD-10-CM | POA: Insufficient documentation

## 2021-05-06 LAB — URINE DRUG SCREEN, QUALITATIVE (ARMC ONLY)
Amphetamines, Ur Screen: NOT DETECTED
Barbiturates, Ur Screen: NOT DETECTED
Benzodiazepine, Ur Scrn: NOT DETECTED
Cannabinoid 50 Ng, Ur ~~LOC~~: POSITIVE — AB
Cocaine Metabolite,Ur ~~LOC~~: POSITIVE — AB
MDMA (Ecstasy)Ur Screen: NOT DETECTED
Methadone Scn, Ur: NOT DETECTED
Opiate, Ur Screen: NOT DETECTED
Phencyclidine (PCP) Ur S: NOT DETECTED
Tricyclic, Ur Screen: NOT DETECTED

## 2021-05-06 LAB — COMPREHENSIVE METABOLIC PANEL
ALT: 14 U/L (ref 0–44)
AST: 14 U/L — ABNORMAL LOW (ref 15–41)
Albumin: 4.4 g/dL (ref 3.5–5.0)
Alkaline Phosphatase: 63 U/L (ref 38–126)
Anion gap: 6 (ref 5–15)
BUN: 13 mg/dL (ref 6–20)
CO2: 27 mmol/L (ref 22–32)
Calcium: 8.9 mg/dL (ref 8.9–10.3)
Chloride: 104 mmol/L (ref 98–111)
Creatinine, Ser: 0.78 mg/dL (ref 0.61–1.24)
GFR, Estimated: >60 mL/min (ref >60)
Glucose, Bld: 98 mg/dL (ref 70–99)
Potassium: 3.5 mmol/L (ref 3.5–5.1)
Sodium: 137 mmol/L (ref 135–145)
Total Bilirubin: 0.6 mg/dL (ref 0.3–1.2)
Total Protein: 7 g/dL (ref 6.5–8.1)

## 2021-05-06 LAB — CBC
HCT: 41.8 % (ref 39.0–52.0)
Hemoglobin: 14.7 g/dL (ref 13.0–17.0)
MCH: 32.2 pg (ref 26.0–34.0)
MCHC: 35.2 g/dL (ref 30.0–36.0)
MCV: 91.5 fL (ref 80.0–100.0)
Platelets: 234 10*3/uL (ref 150–400)
RBC: 4.57 MIL/uL (ref 4.22–5.81)
RDW: 12.4 % (ref 11.5–15.5)
WBC: 9.3 10*3/uL (ref 4.0–10.5)
nRBC: 0 % (ref 0.0–0.2)

## 2021-05-06 LAB — RESP PANEL BY RT-PCR (FLU A&B, COVID) ARPGX2
Influenza A by PCR: NEGATIVE
Influenza B by PCR: NEGATIVE
SARS Coronavirus 2 by RT PCR: NEGATIVE

## 2021-05-06 LAB — ETHANOL: Alcohol, Ethyl (B): 69 mg/dL — ABNORMAL HIGH (ref <10)

## 2021-05-06 LAB — SALICYLATE LEVEL: Salicylate Lvl: <7 mg/dL — ABNORMAL LOW (ref 7.0–30.0)

## 2021-05-06 LAB — ACETAMINOPHEN LEVEL: Acetaminophen (Tylenol), Serum: <10 ug/mL — ABNORMAL LOW (ref 10–30)

## 2021-05-06 NOTE — ED Provider Notes (Signed)
[facility] Provider Note    Event Date/Time   First MD Initiated Contact with Patient 05/06/21 2033     (approximate)   History   Psychiatric Evaluation   HPI  David Lowe is a 31 y.o. male with past medical history of depression, cocaine use disorder and meth use disorder who presents under IVC from law enforcement.  Patient has no complaints at this time.  Does state he has been up for several weeks.  Admits to using cocaine no other drugs.  Denies any medical complaints at this time.  He denies suicidal or homicidal ideation.  Says that he was minding his own business when police stopped him and brought him here he is not sure why.  The IVC paperwork which states that patient has been up for 5 days straight expressing paranoid thoughts and has been playing in traffic.  Also having visual and auditory hallucinations.  Also been seen burning legal paperwork at his house.    Past Medical History:  Diagnosis Date   Anxiety attack    MDD (major depressive disorder)     Patient Active Problem List   Diagnosis Date Noted   Substance abuse ([place]) 12/27/2017   MDD (major depressive disorder), recurrent, severe, with psychosis ([place]) 12/27/2017   Alcohol dependence with alcohol-induced mood disorder ([place])    Benzodiazepine abuse ([place])    Unspecified depressive disorder 03/19/2016   Cocaine use disorder, severe, dependence ([place]) 03/19/2016   Opioid use disorder, moderate, dependence ([place]) 03/19/2016   Cannabis use disorder, severe, dependence ([place]) 03/19/2016   Tobacco use disorder 03/19/2016   Sedative, hypnotic or anxiolytic dependence ([place]) 03/19/2016   Benzodiazepine withdrawal ([place]) 03/19/2016     Physical Exam  Triage Vital Signs: ED Triage Vitals  Enc Vitals Group     BP 05/06/21 2005 (!) 134/96     Pulse Rate 05/06/21 2005 87     Resp 05/06/21 2005 18     Temp 05/06/21 2005 98.4 F (36.9 C)     Temp Source 05/06/21 2005 Oral     SpO2 05/06/21  2005 98 %     Weight 05/06/21 2002 130 lb (59 kg)     Height 05/06/21 2002 5\' 10"  (1.778 m)     Head Circumference --      Peak Flow --      Pain Score 05/06/21 2002 0     Pain Loc --      Pain Edu? --      Excl. in [place]? --     Most recent vital signs: Vitals:   05/06/21 2005  BP: (!) 134/96  Pulse: 87  Resp: 18  Temp: 98.4 F (36.9 C)  SpO2: 98%     General: Awake, no distress.  CV:  Good peripheral perfusion.  Resp:  Normal effort.  Abd:  No distention.  Neuro:             Awake, Alert, Oriented x 3  Other:  Patient is calm and cooperative, normal speech, no agitation   ED Results / Procedures / Treatments  Labs (all labs ordered are listed, but only abnormal results are displayed) Labs Reviewed  URINE DRUG SCREEN, QUALITATIVE ([place]) - Abnormal; Notable for the following components:      Result Value   Cocaine Metabolite,Ur [place] POSITIVE (*)    Cannabinoid 50 Ng, Ur [place] POSITIVE (*)    All other components within normal limits  RESP PANEL BY RT-PCR (FLU A&B, COVID) ARPGX2  COMPREHENSIVE METABOLIC PANEL  ETHANOL  SALICYLATE LEVEL  ACETAMINOPHEN LEVEL  CBC     EKG     RADIOLOGY    PROCEDURES:  Critical Care performed: No    MEDICATIONS ORDERED IN ED: Medications - No data to display   IMPRESSION / MDM / [place] / ED COURSE  I reviewed the triage vital signs and the nursing notes.                              Differential diagnosis includes, but is not limited to, psychosis, drug-induced psychosis, depression anxiety, schizophrenia   31 year old male who is known to use cocaine and crystal meth presents under IVC by law enforcement for paranoia and agitation.  His vital signs are within normal limits.  On my evaluation patient is quite calm and collected.  Does admit to using cocaine that he has been up for multiple days straight.  He denies suicidal or homicidal ideation to me.  He refused labs from triage.  UDS positive for  cocaine and cannabinoids.  Will continue the IVC and consult psychiatry.  The patient has been placed in psychiatric observation due to the need to provide a safe environment for the patient while obtaining psychiatric consultation and evaluation, as well as ongoing medical and medication management to treat the patient's condition.  The patient has been placed under full IVC at this time.       FINAL CLINICAL IMPRESSION(S) / ED DIAGNOSES   Final diagnoses:  Paranoia ([place])     Rx / DC Orders   ED Discharge Orders     None        Note:  This document was prepared using Dragon voice recognition software and may include unintentional dictation errors.   Rada Hay, MD 05/06/21 2106

## 2021-05-06 NOTE — ED Notes (Signed)
Brown coat [employer] Black check shirt [employer] White socks Green underwear [employer] belt

## 2021-05-06 NOTE — ED Notes (Signed)
Pt. Alert and oriented, warm and dry, in no distress. Pt. Denies SI, HI, and AVH. Pt states, "I don't know why I am here, I didn't do anything. The police just showed up."  Writer asked patient about drinking or drug use patient states, "I drank a couple of beers today and did a line of powder." Pt. Encouraged to let nursing staff know of any concerns or needs.  ENVIRONMENTAL ASSESSMENT Potentially harmful objects out of patient reach: Yes.   Personal belongings secured: Yes.   Patient dressed in hospital provided attire only: Yes.   Plastic bags out of patient reach: Yes.   Patient care equipment (cords, cables, call bells, lines, and drains) shortened, removed, or accounted for: Yes.   Equipment and supplies removed from bottom of stretcher: Yes.   Potentially toxic materials out of patient reach: Yes.   Sharps container removed or out of patient reach: Yes.

## 2021-05-06 NOTE — ED Notes (Signed)
Per pts request they had received a sandwich tray, water and a blanket

## 2021-05-06 NOTE — ED Triage Notes (Addendum)
Pt brought in by [place] sheriff dept. Pt is IVC.  Pt is using meth and has paranoia.  Pt denies SI or HI.  Pt denies drug use today.  States etoh use today  pt refusing blood draw in triage.  Ua obtained.

## 2021-05-07 DIAGNOSIS — F1910 Other psychoactive substance abuse, uncomplicated: Secondary | ICD-10-CM

## 2021-05-07 NOTE — ED Notes (Signed)
Pt in hallway.  "Does this hospital not have any doctors?"  Pt upset he has to wait to be evaluated by the NP.  Pt encouraged to remain calm and return to his room.

## 2021-05-07 NOTE — ED Notes (Signed)
Pt is cleared and will be DC later today

## 2021-05-07 NOTE — ED Notes (Signed)
IVC/Pt is cleared and will be DC later today

## 2021-05-07 NOTE — Consult Note (Signed)
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 male patient admitted with paranoia in the context of illicit substance use.  HPI:  See previous assessment for admission details On assessment today, patient is no longer exhibiting signs of paranoia. Is clear and coherent. Denies SI/HI/AVH. Patient has displayed safe behaviors and is requesting discharge. Patient does not meet criteria for inpatient IVC at this time. Writer counseled patient regarding substance use.   Past Psychiatric History: see previuos  Risk to Self:   Risk to Others:   Prior Inpatient Therapy:   Prior Outpatient Therapy:    Past Medical History:  Past Medical History:  Diagnosis Date   Anxiety attack    MDD (major depressive disorder)    No past surgical history on file. Family History: No family history on file. Family Psychiatric  History: see previous Social History:  Social History   Substance and Sexual Activity  Alcohol Use Yes   Comment: last use today,  6 beers     Social History   Substance and Sexual Activity  Drug Use Yes   Types: Marijuana, Cocaine, Benzodiazepines, Methamphetamines   Comment: today    Social History   Socioeconomic History   Marital status: Single    Spouse name: Not on file   Number of  children: Not on file   Years of education: Not on file   Highest education level: Not on file  Occupational History   Not on file  Tobacco Use   Smoking status: Every Day    Packs/day: 0.50    Types: Cigarettes   Smokeless tobacco: Never   Tobacco comments:    declined  Vaping Use   Vaping Use: Never used  Substance and Sexual Activity   Alcohol use: Yes    Comment: last use today,  6 beers   Drug use: Yes    Types: Marijuana, Cocaine, Benzodiazepines, Methamphetamines    Comment: today   Sexual activity: Yes  Other Topics Concern   Not on file  Social History Narrative   Not on file   Social Determinants of Health   Financial Resource Strain: Not on file  Food Insecurity: Not on file  Transportation Needs: Not on file  Physical Activity: Not on file  Stress: Not on file  Social Connections: Not on file   Additional Social History:    Allergies:   Allergies  Allergen Reactions   Bactericin [Bacitracin] Hives   Keflet [Cephalexin] Hives   Seroquel [Quetiapine] Itching   Trazodone Itching   Zyrtec [Cetirizine] Other (See Comments)    Makes patient stay awake    Labs:  Results for orders placed or performed during the hospital encounter of 05/06/21 (from the past 48 hour(s))  Urine Drug Screen, Qualitative     Status: Abnormal   Collection Time: 05/06/21  8:06 PM  Result Value Ref Range   Tricyclic,  Ur Screen NONE DETECTED NONE DETECTED   Amphetamines, Ur Screen NONE DETECTED NONE DETECTED   MDMA (Ecstasy)Ur Screen NONE DETECTED NONE DETECTED   Cocaine Metabolite,Ur [place] POSITIVE (A) NONE DETECTED   Opiate, Ur Screen NONE DETECTED NONE DETECTED   Phencyclidine (PCP) Ur S NONE DETECTED NONE DETECTED   Cannabinoid 50 Ng, Ur [place] POSITIVE (A) NONE DETECTED   Barbiturates, Ur Screen NONE DETECTED NONE DETECTED   Benzodiazepine, Ur Scrn NONE DETECTED NONE DETECTED   Methadone Scn, Ur NONE DETECTED NONE DETECTED    Comment: (NOTE) Tricyclics + metabolites, urine     Cutoff 1000 ng/mL Amphetamines + metabolites, urine  Cutoff 1000 ng/mL MDMA (Ecstasy), urine              Cutoff 500 ng/mL Cocaine Metabolite, urine          Cutoff 300 ng/mL Opiate + metabolites, urine        Cutoff 300 ng/mL Phencyclidine (PCP), urine         Cutoff 25 ng/mL Cannabinoid, urine                 Cutoff 50 ng/mL Barbiturates + metabolites, urine  Cutoff 200 ng/mL Benzodiazepine, urine              Cutoff 200 ng/mL Methadone, urine                   Cutoff 300 ng/mL  The urine drug screen provides only a preliminary, unconfirmed analytical test result and should not be used for non-medical purposes. Clinical consideration and professional judgment should be applied to any positive drug screen result due to possible interfering substances. A more specific alternate chemical method must be used in order to obtain a confirmed analytical result. Gas chromatography / mass spectrometry (GC/MS) is the preferred confirm atory method. Performed at [facility], [address] [place], [place] [address]   Comprehensive metabolic panel     Status: Abnormal   Collection Time: 05/06/21  9:17 PM  Result Value Ref Range   Sodium 137 135 - 145 mmol/L   Potassium 3.5 3.5 - 5.1 mmol/L   Chloride 104 98 - 111 mmol/L   CO2 27 22 - 32 mmol/L   Glucose, Bld 98 70 - 99 mg/dL    Comment: Glucose reference range applies only to samples taken after fasting for at least 8 hours.   BUN 13 6 - 20 mg/dL   Creatinine, Ser 6.040.78 0.61 - 1.24 mg/dL   Calcium 8.9 8.9 - 54.010.3 mg/dL   Total Protein 7.0 6.5 - 8.1 g/dL   Albumin 4.4 3.5 - 5.0 g/dL   AST 14 (L) 15 - 41 U/L   ALT 14 0 - 44 U/L   Alkaline Phosphatase 63 38 - 126 U/L   Total Bilirubin 0.6 0.3 - 1.2 mg/dL   GFR, Estimated >98>60 >11>60 mL/min    Comment: (NOTE) Calculated using the CKD-EPI Creatinine Equation (2021)    Anion gap 6 5 - 15    Comment: Performed at [facility], [address] [place], [place] [address]   Ethanol     Status: Abnormal   Collection Time: 05/06/21  9:17 PM  Result Value Ref Range   Alcohol, Ethyl (B) 69 (H) <10 mg/dL    Comment: (NOTE) Lowest detectable limit for serum alcohol is 10 mg/dL.  For medical purposes only. Performed at [facility], [address] [place], [place] [address]   Salicylate level  Status: Abnormal   Collection Time: 05/06/21  9:17 PM  Result Value Ref Range   Salicylate Lvl Q000111Q (L) 7.0 - 30.0 mg/dL    Comment: Performed at [facility], [address] [place], [place] [address]  Acetaminophen level     Status: Abnormal   Collection Time: 05/06/21  9:17 PM  Result Value Ref Range   Acetaminophen (Tylenol), Serum <10 (L) 10 - 30 ug/mL    Comment: (NOTE) Therapeutic concentrations vary significantly. A range of 10-30 ug/mL  may be an effective concentration for many patients. However, some  are best treated at concentrations outside of this range. Acetaminophen concentrations >150 ug/mL at 4 hours after ingestion  and >50 ug/mL at 12 hours after ingestion are often associated with  toxic reactions.  Performed at [facility], [address] [place], [place] [address]   cbc     Status: None   Collection Time: 05/06/21  9:17 PM  Result Value Ref Range   WBC 9.3 4.0 - 10.5 K/uL   RBC 4.57 4.22 - 5.81 MIL/uL   Hemoglobin 14.7 13.0 - 17.0 g/dL   HCT 41.8 39.0 - 52.0 %   MCV 91.5 80.0 - 100.0 fL   MCH 32.2 26.0 - 34.0 pg   MCHC 35.2 30.0 - 36.0 g/dL   RDW 12.4 11.5 - 15.5 %   Platelets 234 150 - 400 K/uL   nRBC 0.0 0.0 - 0.2 %    Comment: Performed at [facility], [address], [place],  [address]  Resp Panel by RT-PCR (Flu A&B, Covid) Nasopharyngeal Swab     Status: None   Collection Time: 05/06/21  9:17 PM   Specimen: Nasopharyngeal Swab; Nasopharyngeal(NP) swabs in vial transport medium  Result Value Ref Range   SARS Coronavirus 2 by RT PCR NEGATIVE NEGATIVE    Comment:  (NOTE) SARS-CoV-2 target nucleic acids are NOT DETECTED.  The SARS-CoV-2 RNA is generally detectable in upper respiratory specimens during the acute phase of infection. The lowest concentration of SARS-CoV-2 viral copies this assay can detect is 138 copies/mL. A negative result does not preclude SARS-Cov-2 infection and should not be used as the sole basis for treatment or other patient management decisions. A negative result may occur with  improper specimen collection/handling, submission of specimen other than nasopharyngeal swab, presence of viral mutation(s) within the areas targeted by this assay, and inadequate number of viral copies(<138 copies/mL). A negative result must be combined with clinical observations, patient history, and epidemiological information. The expected result is Negative.  Fact Sheet for Patients:  EntrepreneurPulse.com.au  Fact Sheet for Healthcare Providers:  IncredibleEmployment.be  This test is no t yet approved or cleared by the [place] FDA and  has been authorized for detection and/or diagnosis of SARS-CoV-2 by FDA under an Emergency Use Authorization (EUA). This EUA will remain  in effect (meaning this test can be used) for the duration of the COVID-19 declaration under Section 564(b)(1) of the Act, 21 U.S.C.section 360bbb-3(b)(1), unless the authorization is terminated  or revoked sooner.       Influenza A by PCR NEGATIVE NEGATIVE   Influenza B by PCR NEGATIVE NEGATIVE    Comment: (NOTE) The Xpert Xpress SARS-CoV-2/FLU/RSV plus assay is intended as an aid in the diagnosis of influenza from Nasopharyngeal swab specimens and should not be used as a sole basis for treatment. Nasal washings and aspirates are unacceptable for Xpert Xpress SARS-CoV-2/FLU/RSV testing.  Fact Sheet for Patients: EntrepreneurPulse.com.au  Fact Sheet  for Healthcare  Providers: IncredibleEmployment.be  This test is not yet approved or cleared by the [place] and has been authorized for detection and/or diagnosis of SARS-CoV-2 by FDA under an Emergency Use Authorization (EUA). This EUA will remain in effect (meaning this test can be used) for the duration of the COVID-19 declaration under Section 564(b)(1) of the Act, 21 U.S.C. section 360bbb-3(b)(1), unless the authorization is terminated or revoked.  Performed at [facility], [address] [place], [place] [address]     No current facility-administered medications for this encounter.   Current Outpatient Medications  Medication Sig Dispense Refill   buPROPion (WELLBUTRIN SR) 150 MG 12 hr tablet Take 300 mg by mouth daily. (Patient not taking: Reported on 07/02/2020)     doxepin (SINEQUAN) 100 MG capsule Take 100 mg by mouth at bedtime. (Patient not taking: Reported on 07/02/2020)      Musculoskeletal: Strength & Muscle Tone: within normal limits Gait & Station: normal Patient leans: N/A  Psychiatric Specialty Exam:  Presentation  General Appearance: Appropriate for Environment  Eye Contact:Good  Speech:Clear and Coherent  Speech Volume:Normal  Handedness:Right   Mood and Affect  Mood:Euthymic  Affect:Appropriate   Thought Process  Thought Processes:Coherent  Descriptions of Associations:Intact  Orientation:Full (Time, Place and Person)  Thought Content:Logical; WDL  History of Schizophrenia/Schizoaffective disorder:No  Duration of Psychotic Symptoms:No data recorded Hallucinations:Hallucinations: None  Ideas of Reference:None  Suicidal Thoughts:Suicidal Thoughts: No  Homicidal Thoughts:Homicidal Thoughts: No   Sensorium  Memory:Immediate Good  Judgment:Fair  Insight:Fair   Executive Functions  Concentration:Fair  Attention Span:Fair  [place]   Psychomotor  Activity  Psychomotor Activity:Psychomotor Activity: Normal   Assets  Assets:Housing; Social Support; Catering manager; Vocational/Educational; Physical Health   Sleep  Sleep:Sleep: Good Number of Hours of Sleep: 8   Physical Exam: Physical Exam Vitals and nursing note reviewed.  HENT:     Head: Normocephalic.     Nose: No congestion.  Cardiovascular:     Rate and Rhythm: Normal rate.  Musculoskeletal:        General: Normal range of motion.  Skin:    General: Skin is dry.  Neurological:     Mental Status: He is alert and oriented to person, place, and time.  Psychiatric:        Behavior: Behavior normal.   Review of Systems  Psychiatric/Behavioral:  Positive for substance abuse. Negative for depression, hallucinations, memory loss and suicidal ideas. The patient is not nervous/anxious and does not have insomnia.   All other systems reviewed and are negative. Blood pressure 130/86, pulse 86, temperature 98.2 F (36.8 C), resp. rate 18, height 5\' 10"  (1.778 m), weight 59 kg, SpO2 96 %. Body mass index is 18.65 kg/m.  Treatment Plan Summary: Discharge with recommendation for outpatient treatment for substance use disorder   Disposition: No evidence of imminent risk to self or others at present.    Sherlon Handing, NP 05/07/2021 4:07 PM

## 2021-05-07 NOTE — ED Notes (Signed)
IVC/Pt is cleared and will be DC later today/IVC to be reversed

## 2021-05-07 NOTE — ED Notes (Signed)
Pt to be discharged

## 2021-05-07 NOTE — ED Notes (Signed)
Pt dressing for discharge.  

## 2021-05-07 NOTE — BH Assessment (Addendum)
Comprehensive Clinical Assessment (CCA) Note  05/07/2021 David Lowe [id number] Recommendations for Services/Supports/Treatments: Consulted with Lynder Parents., NP, who recommended overnight observation and reassessment in the AM. Notified Dr. Starleen Blue and Maudie Mercury, RN of disposition recommendation.    David Lowe is a 31 year old patient who presented to [facility] ED, involuntarily. Per IVC, pt presents with paranoid thoughts, a lack of sleep for five days, auditory hallucinations, and was reportedly playing in traffic. Upon assessment, pt was drowsy and oriented x4. It was noted that the pt. had no insight and impaired judgement. When asked why he'd been brought to the hospital pt stated, Miscellaneous. Pt was guarded and resistant throughout the assessment, refusing to answer many assessment questions. Pt has a chronic hx of polysubstance abuse. Pt's UDS was positive for cocaine and cannabis. BAL was unremarkable. Pt presented with a disheveled appearance. Pt's protective factors are being physically healthy. Pt's speech was clear and coherent. presented with an appropriate mood and an appropriate affect. Pt denied current SI/HI/AV/H.   Chief Complaint:  Chief Complaint  Patient presents with   Psychiatric Evaluation   Visit Diagnosis: Polysubstance abuse    CCA Screening, Triage and Referral (STR)  Patient Reported Information How did you hear about [place]? Self  Referral name: No data recorded Referral phone number: No data recorded  Whom do you see for routine medical problems? I don't have a doctor  Practice/Facility Name: No data recorded Practice/Facility Phone Number: No data recorded Name of Contact: No data recorded Contact Number: No data recorded Contact Fax Number: No data recorded Prescriber Name: No data recorded Prescriber Address (if known): No data recorded  What Is the Reason for Your Visit/Call Today? Pt presents under IVC by [place] [facility]. Per IVC Pt is using meth and has  paranoia. Per pt chart, has not slept in days, plays in traffic, and is hallucinating.  How Long Has This Been Causing You Problems? > than 6 months  What Do You Feel Would Help You the Most Today? Alcohol or Drug Use Treatment   Have You Recently Been in Any Inpatient Treatment (Hospital/Detox/Crisis Center/28-Day Program)? No  Name/Location of Program/Hospital:No data recorded How Long Were You There? No data recorded When Were You Discharged? No data recorded  Have You Ever Received Services From [facility] Before? No  Who Do You See at [facility]? No data recorded  Have You Recently Had Any Thoughts About Hurting Yourself? No  Are You Planning to Commit Suicide/Harm Yourself At This time? No   Have you Recently Had Thoughts About [place]? No  Explanation: No data recorded  Have You Used Any Alcohol or Drugs in the Past 24 Hours? Yes  How Long Ago Did You Use Drugs or Alcohol? No data recorded What Did You Use and How Much? Methamphetamine, Alcohol   Do You Currently Have a Therapist/Psychiatrist? No  Name of Therapist/Psychiatrist: No data recorded  Have You Been Recently Discharged From Any Office Practice or Programs? No  Explanation of Discharge From Practice/Program: No data recorded    CCA Screening Triage Referral Assessment Type of Contact: Face-to-Face  Is this Initial or Reassessment? No data recorded Date Telepsych consult ordered in [facility]:  No data recorded Time Telepsych consult ordered in [facility]:  No data recorded  Patient Reported Information Reviewed? Yes  Patient Left Without Being Seen? No data recorded Reason for Not Completing Assessment: No data recorded  Collateral Involvement: None provided   Does Patient Have a [place]? No data  recorded Name and Contact of Legal Guardian: No data recorded If Minor and Not Living with Parent(s), Who has Custody? n/a  Is CPS involved or ever been involved?  Never  Is APS involved or ever been involved? Never   Patient Determined To Be At Risk for Harm To Self or Others Based on Review of Patient Reported Information or Presenting Complaint? No  Method: No data recorded Availability of Means: No data recorded Intent: No data recorded Notification Required: No data recorded Additional Information for Danger to Others Potential: No data recorded Additional Comments for Danger to Others Potential: No data recorded Are There Guns or Other Weapons in Your Home? No data recorded Types of Guns/Weapons: No data recorded Are These Weapons Safely Secured?                            No data recorded Who Could Verify You Are Able To Have These Secured: No data recorded Do You Have any Outstanding Charges, Pending Court Dates, Parole/Probation? No data recorded Contacted To Inform of Risk of Harm To Self or Others: No data recorded  Location of Assessment: [facility] ED   Does Patient Present under Involuntary Commitment? Yes  IVC Papers Initial File Date: 05/06/21   [place] of Residence: [place]   Patient Currently Receiving the Following Services: Not Receiving Services   Determination of Need: Emergent (2 hours)   Options For Referral: Therapeutic Triage Services     CCA Biopsychosocial Intake/Chief Complaint:  Patient is presenting to [facility] requesting detox treatment  Current Symptoms/Problems: Patient is presenting to [facility] requesting detox treatment   Patient Reported Schizophrenia/Schizoaffective Diagnosis in Past: No   Strengths: Patient is able to communicate his needs  Preferences: Unknown  Abilities: Patient is able to communicate his needs   Type of Services Patient Feels are Needed: Detox treatment   Initial Clinical Notes/Concerns: None   Mental Health Symptoms Depression:   None   Duration of Depressive symptoms:  Less than two weeks   Mania:   Change in energy/activity; Recklessness; Overconfidence;  Irritability   Anxiety:    N/A   Psychosis:   None   Duration of Psychotic symptoms: No data recorded  Trauma:   N/A   Obsessions:   None   Compulsions:   "Driven" to perform behaviors/acts; Intended to reduce stress or prevent another outcome; Poor Insight   Inattention:   None   Hyperactivity/Impulsivity:   N/A   Oppositional/Defiant Behaviors:   None   Emotional Irregularity:   Potentially harmful impulsivity   Other Mood/Personality Symptoms:  No data recorded   Mental Status Exam Appearance and self-care  Stature:   Average   Weight:   Average weight   Clothing:   -- (In scrubs)   Grooming:   Neglected   Cosmetic use:   None   Posture/gait:   Normal   Motor activity:   Not Remarkable   Sensorium  Attention:   Normal   Concentration:   Normal   Orientation:   Object; Person; Place; Situation   Recall/memory:   Normal   Affect and Mood  Affect:   Appropriate   Mood:   Euthymic   Relating  Eye contact:   None   Facial expression:   Responsive   Attitude toward examiner:   Guarded; Passive   Thought and Language  Speech flow:  Clear and Coherent   Thought content:   Appropriate to Mood and Circumstances  Preoccupation:   None   Hallucinations:   None   Organization:  No data recorded  [employer] of Knowledge:   Fair   Intelligence:   Average   Abstraction:   Concrete   Judgement:   Dangerous   Reality Testing:   Adequate   Insight:   None/zero insight   Decision Making:   Impulsive   Social Functioning  Social Maturity:   Irresponsible   Social Judgement:   Normal   Stress  Stressors:   Other (Comment) (Current substance abuse issues)   Coping Ability:   Exhausted   Skill Deficits:   Decision making; Responsibility; Self-control   Supports:   Support needed     Religion: Religion/Spirituality Are You A Religious Person?: No  Leisure/Recreation: Leisure /  Recreation Do You Have Hobbies?: No  Exercise/Diet: Exercise/Diet Do You Exercise?: No Have You Gained or Lost A Significant Amount of Weight in the Past Six Months?: No Do You Follow a Special Diet?: No Do You Have Any Trouble Sleeping?: No   CCA Employment/Education Employment/Work Situation: Employment / Work Situation Employment Situation: Unemployed Work Stressors: n/a Patient's Job has Been Impacted by Current Illness: No Has Patient ever Been in Passenger transport manager?: No  Education: Education Is Patient Currently Attending School?: No Did Physicist, medical?: No Did You Have An Individualized Education Program (IIEP): No Did You Have Any Difficulty At [employer]?: No Patient's Education Has Been Impacted by Current Illness: No   CCA Family/Childhood History Family and Relationship History: Family history Marital status: Single Does patient have children?: Yes How many children?: 2 How is patient's relationship with their children?: Patient reports relationship with children are good  Childhood History:  Childhood History By whom was/is the patient raised?: Mother Did patient suffer any verbal/emotional/physical/sexual abuse as a child?: No Did patient suffer from severe childhood neglect?: No Has patient ever been sexually abused/assaulted/raped as an adolescent or adult?: No Was the patient ever a victim of a crime or a disaster?: No Witnessed domestic violence?: No Has patient been affected by domestic violence as an adult?: No  Child/Adolescent Assessment:     CCA Substance Use Alcohol/Drug Use: Alcohol / Drug Use Pain Medications: See MAR Prescriptions: See MAR Over the Counter: See MAR History of alcohol / drug use?: Yes Longest period of sobriety (when/how long): 6 months Negative Consequences of Use: Financial, Personal relationships, Work / Youth worker Withdrawal Symptoms: Agitation                         ASAM's:  Six Dimensions of  Multidimensional Assessment  Dimension 1:  Acute Intoxication and/or Withdrawal Potential:   Dimension 1:  Description of individual's past and current experiences of substance use and withdrawal: Pt has a chronic hx of severe polysubstance abuse  Dimension 2:  Biomedical Conditions and Complications:      Dimension 3:  Emotional, Behavioral, or Cognitive Conditions and Complications:     Dimension 4:  Readiness to Change:     Dimension 5:  Relapse, Continued use, or Continued Problem Potential:     Dimension 6:  Recovery/Living Environment:     ASAM Severity Score: ASAM's Severity Rating Score: 12  ASAM Recommended Level of Treatment: ASAM Recommended Level of Treatment: Level III Residential Treatment   Substance use Disorder (SUD) Substance Use Disorder (SUD)  Checklist Symptoms of Substance Use: Continued use despite having a persistent/recurrent physical/psychological problem caused/exacerbated by use, Evidence of tolerance, Evidence of withdrawal (  Comment), Presence of craving or strong urge to use, Social, occupational, recreational activities given up or reduced due to use, Large amounts of time spent to obtain, use or recover from the substance(s), Recurrent use that results in a failure to fulfill major role obligations (work, school, home), Substance(s) often taken in larger amounts or over longer times than was intended, Continued use despite persistent or recurrent social, interpersonal problems, caused or exacerbated by use, Persistent desire or unsuccessful efforts to cut down or control use, Repeated use in physically hazardous situations  Recommendations for Services/Supports/Treatments: Recommendations for Services/Supports/Treatments Recommendations For Services/Supports/Treatments: Detox  DSM5 Diagnoses: Patient Active Problem List   Diagnosis Date Noted   Substance abuse ([place]) 12/27/2017   MDD (major depressive disorder), recurrent, severe, with psychosis ([place])  12/27/2017   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

## 2021-05-07 NOTE — ED Provider Notes (Signed)
Emergency Medicine Observation Re-evaluation Note  David Lowe is a 31 y.o. male, seen on rounds today.  Pt initially presented to the ED for complaints of Psychiatric Evaluation Currently, the patient is resting, voices no medical complaints.  Physical Exam  BP (!) 134/96 (BP Location: Left Arm)    Pulse 87    Temp 98.4 F (36.9 C) (Oral)    Resp 18    Ht 5\' 10"  (1.778 m)    Wt 59 kg    SpO2 98%    BMI 18.65 kg/m  Physical Exam General: Resting in no acute distress Cardiac: No cyanosis Lungs: Equal rise and fall Psych: Not agitated  ED Course / MDM  EKG:   I have reviewed the labs performed to date as well as medications administered while in observation.  Recent changes in the last 24 hours include no events overnight. Patient was upset overnight that he could not leave due to IVC but was able to be verbally redirected.  Plan  Current plan is for psych reassessment this morning with likely psych rescinding IVC. Tavish Balzarini is under involuntary commitment.      Aurelio Brash, MD 05/07/21 [phone]

## 2021-05-07 NOTE — ED Notes (Signed)
Pt discharged home. VS stable. All belongings returned to patient. Pt denies SI.  

## 2021-05-07 NOTE — Consult Note (Signed)
[facility] Face-to-Face Psychiatry Consult   Reason for Consult:Psychiatric Evaluation  Referring Physician: Dr. Sidney Ace Patient Identification: David Lowe MRN:  [id number] Principal Diagnosis: <principal problem not specified> Diagnosis:  Active Problems:   Opioid use disorder, moderate, dependence (HCC)   Cannabis use disorder, severe, dependence (HCC)   Tobacco use disorder   Sedative, hypnotic or anxiolytic dependence (HCC)   Benzodiazepine withdrawal (HCC)   Substance abuse (HCC)   Alcohol dependence with alcohol-induced mood disorder (HCC)   Benzodiazepine abuse (HCC)   MDD (major depressive disorder), recurrent, severe, with psychosis (HCC)   Total Time spent with patient: 30 minutes  Subjective:  David Lowe is a 31 y.o. male patient presented to [facility] ED via law enforcement under involuntary commitment status (IVC). When the patient was asked why he'd been brought to the hospital, he stated, "Miscellaneous." Per the triage nurse's note, "Pt is using meth and has paranoia.  Pt denies SI or HI.  Pt denies drug use today.  States [place] use today  pt refusing blood draw in triage. Ua obtained." The patient's UDS is positive for Cocaine and Cannabinoids. The patient's BAL is 69 mg/dl on today's visit. The patient was seen face-to-face by this provider; the chart was reviewed and consulted with Dr. Sidney Ace on 05/06/2021 due to the patient's care. It was discussed with the EDP that the patient remained under observation overnight and will be reassessed in the a.m. to determine if he meets the criteria for psychiatric inpatient admission; he could be discharged home. On evaluation, the patient is difficult to arouse or maintain alertness during his assessment. Once awakened, he is oriented x 2-3, calm, cooperative, and mood-congruent with affect. The patient does not appear to be responding to internal or external stimuli. Neither is the patient presenting with any delusional thinking. The patient denies  auditory or visual hallucinations. The patient denies any suicidal, homicidal, or self-harm ideations. The patient is presenting with some psychotic and paranoid behaviors.   HPI: Per Dr. Sidney Ace, David Lowe is a 31 y.o. male with past medical history of depression, cocaine use disorder and meth use disorder who presents under IVC from law enforcement.  Patient has no complaints at this time.  Does state he has been up for several weeks.  Admits to using cocaine no other drugs.  Denies any medical complaints at this time.  He denies suicidal or homicidal ideation.  Says that he was minding his own business when police stopped him and brought him here he is not sure why.   The IVC paperwork which states that patient has been up for 5 days straight expressing paranoid thoughts and has been playing in traffic.  Also having visual and auditory hallucinations.  Also been seen burning legal paperwork at his house.  Past Psychiatric History:  Anxiety attack   MDD (major depressive disorder)    Risk to Self:   Risk to Others:   Prior Inpatient Therapy:   Prior Outpatient Therapy:    Past Medical History:  Past Medical History:  Diagnosis Date   Anxiety attack    MDD (major depressive disorder)    No past surgical history on file. Family History: No family history on file. Family Psychiatric  History:  Social History:  Social History   Substance and Sexual Activity  Alcohol Use Yes   Comment: last use today,  6 beers     Social History   Substance and Sexual Activity  Drug Use Yes   Types: Marijuana, Cocaine, Benzodiazepines, Methamphetamines  Comment: today    Social History   Socioeconomic History   Marital status: Single    Spouse name: Not on file   Number of children: Not on file   Years of education: Not on file   Highest education level: Not on file  Occupational History   Not on file  Tobacco Use   Smoking status: Every Day    Packs/day: 0.50    Types: Cigarettes    Smokeless tobacco: Never   Tobacco comments:    declined  Vaping Use   Vaping Use: Never used  Substance and Sexual Activity   Alcohol use: Yes    Comment: last use today,  6 beers   Drug use: Yes    Types: Marijuana, Cocaine, Benzodiazepines, Methamphetamines    Comment: today   Sexual activity: Yes  Other Topics Concern   Not on file  Social History Narrative   Not on file   Social Determinants of Health   Financial Resource Strain: Not on file  Food Insecurity: Not on file  Transportation Needs: Not on file  Physical Activity: Not on file  Stress: Not on file  Social Connections: Not on file   Additional Social History:    Allergies:   Allergies  Allergen Reactions   Bactericin [Bacitracin] Hives   Keflet [Cephalexin] Hives   Seroquel [Quetiapine] Itching   Trazodone Itching   Zyrtec [Cetirizine] Other (See Comments)    Makes patient stay awake    Labs:  Results for orders placed or performed during the hospital encounter of 05/06/21 (from the past 48 hour(s))  Urine Drug Screen, Qualitative     Status: Abnormal   Collection Time: 05/06/21  8:06 PM  Result Value Ref Range   Tricyclic, Ur Screen NONE DETECTED NONE DETECTED   Amphetamines, Ur Screen NONE DETECTED NONE DETECTED   MDMA (Ecstasy)Ur Screen NONE DETECTED NONE DETECTED   Cocaine Metabolite,Ur [place] POSITIVE (A) NONE DETECTED   Opiate, Ur Screen NONE DETECTED NONE DETECTED   Phencyclidine (PCP) Ur S NONE DETECTED NONE DETECTED   Cannabinoid 50 Ng, Ur [place] POSITIVE (A) NONE DETECTED   Barbiturates, Ur Screen NONE DETECTED NONE DETECTED   Benzodiazepine, Ur Scrn NONE DETECTED NONE DETECTED   Methadone Scn, Ur NONE DETECTED NONE DETECTED    Comment: (NOTE) Tricyclics + metabolites, urine    Cutoff 1000 ng/mL Amphetamines + metabolites, urine  Cutoff 1000 ng/mL MDMA (Ecstasy), urine              Cutoff 500 ng/mL Cocaine Metabolite, urine          Cutoff 300 ng/mL Opiate + metabolites, urine        Cutoff  300 ng/mL Phencyclidine (PCP), urine         Cutoff 25 ng/mL Cannabinoid, urine                 Cutoff 50 ng/mL Barbiturates + metabolites, urine  Cutoff 200 ng/mL Benzodiazepine, urine              Cutoff 200 ng/mL Methadone, urine                   Cutoff 300 ng/mL  The urine drug screen provides only a preliminary, unconfirmed analytical test result and should not be used for non-medical purposes. Clinical consideration and professional judgment should be applied to any positive drug screen result due to possible interfering substances. A more specific alternate chemical method must be used in order to obtain a confirmed  analytical result. Gas chromatography / mass spectrometry (GC/MS) is the preferred confirm atory method. Performed at [facility], [address] [place], [place] [address]   Comprehensive metabolic panel     Status: Abnormal   Collection Time: 05/06/21  9:17 PM  Result Value Ref Range   Sodium 137 135 - 145 mmol/L   Potassium 3.5 3.5 - 5.1 mmol/L   Chloride 104 98 - 111 mmol/L   CO2 27 22 - 32 mmol/L   Glucose, Bld 98 70 - 99 mg/dL    Comment: Glucose reference range applies only to samples taken after fasting for at least 8 hours.   BUN 13 6 - 20 mg/dL   Creatinine, Ser 0.78 0.61 - 1.24 mg/dL   Calcium 8.9 8.9 - 10.3 mg/dL   Total Protein 7.0 6.5 - 8.1 g/dL   Albumin 4.4 3.5 - 5.0 g/dL   AST 14 (L) 15 - 41 U/L   ALT 14 0 - 44 U/L   Alkaline Phosphatase 63 38 - 126 U/L   Total Bilirubin 0.6 0.3 - 1.2 mg/dL   GFR, Estimated >60 >60 mL/min    Comment: (NOTE) Calculated using the CKD-EPI Creatinine Equation (2021)    Anion gap 6 5 - 15    Comment: Performed at [facility], [address] [place], [place] [address]  Ethanol     Status: Abnormal   Collection Time: 05/06/21  9:17 PM  Result Value Ref Range   Alcohol, Ethyl (B) 69 (H) <10 mg/dL    Comment: (NOTE) Lowest detectable limit for serum alcohol is 10 mg/dL.  For medical  purposes only. Performed at [facility], [address] [place], [place] [id number]   Salicylate level     Status: Abnormal   Collection Time: 05/06/21  9:17 PM  Result Value Ref Range   Salicylate Lvl Q000111Q (L) 7.0 - 30.0 mg/dL    Comment: Performed at [facility], [address] [place], [place] [address]  Acetaminophen level     Status: Abnormal   Collection Time: 05/06/21  9:17 PM  Result Value Ref Range   Acetaminophen (Tylenol), Serum <10 (L) 10 - 30 ug/mL    Comment: (NOTE) Therapeutic concentrations vary significantly. A range of 10-30 ug/mL  may be an effective concentration for many patients. However, some  are best treated at concentrations outside of this range. Acetaminophen concentrations >150 ug/mL at 4 hours after ingestion  and >50 ug/mL at 12 hours after ingestion are often associated with  toxic reactions.  Performed at [facility], [address] [place], [place] [address]   cbc     Status: None   Collection Time: 05/06/21  9:17 PM  Result Value Ref Range   WBC 9.3 4.0 - 10.5 K/uL   RBC 4.57 4.22 - 5.81 MIL/uL   Hemoglobin 14.7 13.0 - 17.0 g/dL   HCT 41.8 39.0 - 52.0 %   MCV 91.5 80.0 - 100.0 fL   MCH 32.2 26.0 - 34.0 pg   MCHC 35.2 30.0 - 36.0 g/dL   RDW 12.4 11.5 - 15.5 %   Platelets 234 150 - 400 K/uL   nRBC 0.0 0.0 - 0.2 %    Comment: Performed at [facility], [address] [place], [place] [address]  Resp Panel by RT-PCR (Flu A&B, Covid) Nasopharyngeal Swab     Status: None   Collection Time: 05/06/21  9:17 PM   Specimen: Nasopharyngeal Swab; Nasopharyngeal(NP) swabs in vial transport  medium  Result Value Ref Range   SARS Coronavirus 2 by RT PCR NEGATIVE NEGATIVE    Comment: (NOTE) SARS-CoV-2 target nucleic acids are NOT DETECTED.  The SARS-CoV-2 RNA is generally detectable in upper respiratory specimens during the acute phase of infection. The lowest concentration of SARS-CoV-2 viral copies this  assay can detect is 138 copies/mL. A negative result does not preclude SARS-Cov-2 infection and should not be used as the sole basis for treatment or other patient management decisions. A negative result may occur with  improper specimen collection/handling, submission of specimen other than nasopharyngeal swab, presence of viral mutation(s) within the areas targeted by this assay, and inadequate number of viral copies(<138 copies/mL). A negative result must be combined with clinical observations, patient history, and epidemiological information. The expected result is Negative.  Fact Sheet for Patients:  EntrepreneurPulse.com.au  Fact Sheet for Healthcare Providers:  IncredibleEmployment.be  This test is no t yet approved or cleared by the [place] FDA and  has been authorized for detection and/or diagnosis of SARS-CoV-2 by FDA under an Emergency Use Authorization (EUA). This EUA will remain  in effect (meaning this test can be used) for the duration of the COVID-19 declaration under Section 564(b)(1) of the Act, 21 U.S.C.section 360bbb-3(b)(1), unless the authorization is terminated  or revoked sooner.       Influenza A by PCR NEGATIVE NEGATIVE   Influenza B by PCR NEGATIVE NEGATIVE    Comment: (NOTE) The Xpert Xpress SARS-CoV-2/FLU/RSV plus assay is intended as an aid in the diagnosis of influenza from Nasopharyngeal swab specimens and should not be used as a sole basis for treatment. Nasal washings and aspirates are unacceptable for Xpert Xpress SARS-CoV-2/FLU/RSV testing.  Fact Sheet for Patients: EntrepreneurPulse.com.au  Fact Sheet for Healthcare Providers: IncredibleEmployment.be  This test is not yet approved or cleared by the [place] FDA and has been authorized for detection and/or diagnosis of SARS-CoV-2 by FDA under an Emergency Use Authorization (EUA). This EUA will remain in  effect (meaning this test can be used) for the duration of the COVID-19 declaration under Section 564(b)(1) of the Act, 21 U.S.C. section 360bbb-3(b)(1), unless the authorization is terminated or revoked.  Performed at [facility], [address] [place], [place] [address]     No current facility-administered medications for this encounter.   Current Outpatient Medications  Medication Sig Dispense Refill   buPROPion (WELLBUTRIN SR) 150 MG 12 hr tablet Take 300 mg by mouth daily. (Patient not taking: Reported on 07/02/2020)     doxepin (SINEQUAN) 100 MG capsule Take 100 mg by mouth at bedtime. (Patient not taking: Reported on 07/02/2020)      Musculoskeletal: Strength & Muscle Tone: within normal limits Gait & Station: normal Patient leans: N/A  Psychiatric Specialty Exam:  Presentation  General Appearance: Bizarre  Eye Contact:None  Speech:Slow; Slurred  Speech Volume:Decreased  Handedness:Right   Mood and Affect  Mood:Euphoric  Affect:Blunt; Depressed; Constricted; Inappropriate   Thought Process  Thought Processes:Goal Directed  Descriptions of Associations:Loose  Orientation:Partial  Thought Content:Illogical; Paranoid Ideation  History of Schizophrenia/Schizoaffective disorder:No  Duration of Psychotic Symptoms:No data recorded Hallucinations:Hallucinations: None  Ideas of Reference:Paranoia  Suicidal Thoughts:Suicidal Thoughts: No  Homicidal Thoughts:Homicidal Thoughts: No   Sensorium  Memory:Immediate Poor; Recent Poor; Remote Poor  Judgment:Poor  Insight:Poor   Executive Functions  Concentration:Poor  Attention Span:Poor  Recall:Poor  Fund of Knowledge:Poor  Language:Poor   Psychomotor Activity  Psychomotor Activity:Psychomotor Activity: Normal   Assets  Assets:Communication Skills; Desire for  Improvement; Resilience; Social Support   Sleep  Sleep:Sleep: Good Number of Hours of Sleep: 8   Physical  Exam: Physical Exam Vitals and nursing note reviewed.  Constitutional:      Appearance: Normal appearance.  HENT:     Head: Normocephalic and atraumatic.     Right Ear: External ear normal.     Left Ear: External ear normal.     Nose: Nose normal.  Cardiovascular:     Rate and Rhythm: Normal rate.     Pulses: Normal pulses.  Pulmonary:     Effort: Pulmonary effort is normal.  Musculoskeletal:        General: Normal range of motion.     Cervical back: Normal range of motion and neck supple.  Neurological:     General: No focal deficit present.     Mental Status: He is alert. Mental status is at baseline.  Psychiatric:        Attention and Perception: Attention and perception normal.        Mood and Affect: Mood and affect normal.        Speech: Speech is delayed.        Behavior: Behavior is withdrawn.        Thought Content: Thought content is delusional.        Cognition and Memory: Memory normal. Cognition is impaired.        Judgment: Judgment is impulsive and inappropriate.   ROS Blood pressure (!) 134/96, pulse 87, temperature 98.4 F (36.9 C), temperature source Oral, resp. rate 18, height 5\' 10"  (1.778 m), weight 59 kg, SpO2 98 %. Body mass index is 18.65 kg/m.  Treatment Plan Summary: Plan The patient remained under observation overnight and will be reassessed in the a.m. to determine if he meets the criteria for psychiatric inpatient admission; he could be discharged home.  Disposition: No evidence of imminent risk to self or others at present.   Supportive therapy provided about ongoing stressors. The patient remained under observation overnight and will be reassessed in the a.m. to determine if he meets the criteria for psychiatric inpatient admission; he could be discharged home.  Caroline Sauger, NP 05/07/2021 3:08 AM

## 2021-05-07 NOTE — ED Notes (Addendum)
Writer called to room per pts request to speak to charge. Pt upset that he can not leave. Writer explained to pt that unfortunately, the psychiatrist that will make that decision will not be to ED to see him until after 8AM. Pt also reminded that his behavior from now until that time, reflected on the decisions of the psychiatrist. Security Wiley at doorway to speak and assure pt that he would be seen in the AM and reminded pt that he would need to stay within his room and not out walking hallway due to other pts safety as well as his own.

## 2021-05-07 NOTE — ED Notes (Signed)
Patient awake demanding to leave. Writer explained that he was seen by Psych NP and is recommending reevaluate in the AM. Patient wanting to talk with someone else besides this Clinical research associate. Charge Rn made aware and is speaking with patient at this moment.

## 2021-08-06 ENCOUNTER — Emergency Department
Admission: EM | Admit: 2021-08-06 | Discharge: 2021-08-08 | Disposition: A | Discharge status: Home / Self Care | Payer: Self-pay | Attending: Emergency Medicine | Admitting: Emergency Medicine

## 2021-08-06 DIAGNOSIS — F1310 Sedative, hypnotic or anxiolytic abuse, uncomplicated: Secondary | ICD-10-CM | POA: Insufficient documentation

## 2021-08-06 DIAGNOSIS — R4689 Other symptoms and signs involving appearance and behavior: Secondary | ICD-10-CM

## 2021-08-06 DIAGNOSIS — F333 Major depressive disorder, recurrent, severe with psychotic symptoms: Secondary | ICD-10-CM | POA: Insufficient documentation

## 2021-08-06 DIAGNOSIS — F17200 Nicotine dependence, unspecified, uncomplicated: Secondary | ICD-10-CM | POA: Diagnosis present

## 2021-08-06 DIAGNOSIS — F1120 Opioid dependence, uncomplicated: Secondary | ICD-10-CM | POA: Diagnosis present

## 2021-08-06 DIAGNOSIS — Z046 Encounter for general psychiatric examination, requested by authority: Secondary | ICD-10-CM | POA: Insufficient documentation

## 2021-08-06 DIAGNOSIS — Y900 Blood alcohol level of less than 20 mg/100 ml: Secondary | ICD-10-CM | POA: Insufficient documentation

## 2021-08-06 DIAGNOSIS — F1910 Other psychoactive substance abuse, uncomplicated: Secondary | ICD-10-CM

## 2021-08-06 DIAGNOSIS — F1420 Cocaine dependence, uncomplicated: Secondary | ICD-10-CM | POA: Insufficient documentation

## 2021-08-06 DIAGNOSIS — F10288 Alcohol dependence with other alcohol-induced disorder: Secondary | ICD-10-CM | POA: Insufficient documentation

## 2021-08-06 DIAGNOSIS — F1320 Sedative, hypnotic or anxiolytic dependence, uncomplicated: Secondary | ICD-10-CM | POA: Diagnosis present

## 2021-08-06 DIAGNOSIS — Z87891 Personal history of nicotine dependence: Secondary | ICD-10-CM | POA: Insufficient documentation

## 2021-08-06 DIAGNOSIS — F19959 Other psychoactive substance use, unspecified with psychoactive substance-induced psychotic disorder, unspecified: Secondary | ICD-10-CM

## 2021-08-06 DIAGNOSIS — F13939 Sedative, hypnotic or anxiolytic use, unspecified with withdrawal, unspecified: Secondary | ICD-10-CM | POA: Diagnosis present

## 2021-08-06 DIAGNOSIS — F1024 Alcohol dependence with alcohol-induced mood disorder: Secondary | ICD-10-CM | POA: Diagnosis present

## 2021-08-06 DIAGNOSIS — F32A Depression, unspecified: Secondary | ICD-10-CM | POA: Diagnosis present

## 2021-08-06 DIAGNOSIS — F11259 Opioid dependence with opioid-induced psychotic disorder, unspecified: Secondary | ICD-10-CM | POA: Insufficient documentation

## 2021-08-06 DIAGNOSIS — F1220 Cannabis dependence, uncomplicated: Secondary | ICD-10-CM | POA: Diagnosis present

## 2021-08-06 LAB — COMPREHENSIVE METABOLIC PANEL
ALT: 24 U/L (ref 0–44)
AST: 23 U/L (ref 15–41)
Albumin: 4.6 g/dL (ref 3.5–5.0)
Alkaline Phosphatase: 75 U/L (ref 38–126)
Anion gap: 8 (ref 5–15)
BUN: 22 mg/dL — ABNORMAL HIGH (ref 6–20)
CO2: 27 mmol/L (ref 22–32)
Calcium: 9.5 mg/dL (ref 8.9–10.3)
Chloride: 103 mmol/L (ref 98–111)
Creatinine, Ser: 0.96 mg/dL (ref 0.61–1.24)
GFR, Estimated: >60 mL/min (ref >60)
Glucose, Bld: 98 mg/dL (ref 70–99)
Potassium: 4.3 mmol/L (ref 3.5–5.1)
Sodium: 138 mmol/L (ref 135–145)
Total Bilirubin: 1.2 mg/dL (ref 0.3–1.2)
Total Protein: 7.8 g/dL (ref 6.5–8.1)

## 2021-08-06 LAB — URINE DRUG SCREEN, QUALITATIVE (ARMC ONLY)
Amphetamines, Ur Screen: POSITIVE — AB
Barbiturates, Ur Screen: NOT DETECTED
Benzodiazepine, Ur Scrn: NOT DETECTED
Cannabinoid 50 Ng, Ur ~~LOC~~: POSITIVE — AB
Cocaine Metabolite,Ur ~~LOC~~: NOT DETECTED
MDMA (Ecstasy)Ur Screen: NOT DETECTED
Methadone Scn, Ur: NOT DETECTED
Opiate, Ur Screen: NOT DETECTED
Phencyclidine (PCP) Ur S: NOT DETECTED
Tricyclic, Ur Screen: NOT DETECTED

## 2021-08-06 LAB — CBC
HCT: 41.5 % (ref 39.0–52.0)
Hemoglobin: 14.3 g/dL (ref 13.0–17.0)
MCH: 31.2 pg (ref 26.0–34.0)
MCHC: 34.5 g/dL (ref 30.0–36.0)
MCV: 90.4 fL (ref 80.0–100.0)
Platelets: 251 10*3/uL (ref 150–400)
RBC: 4.59 MIL/uL (ref 4.22–5.81)
RDW: 12.5 % (ref 11.5–15.5)
WBC: 8.9 10*3/uL (ref 4.0–10.5)
nRBC: 0 % (ref 0.0–0.2)

## 2021-08-06 LAB — ACETAMINOPHEN LEVEL: Acetaminophen (Tylenol), Serum: <10 ug/mL — ABNORMAL LOW (ref 10–30)

## 2021-08-06 LAB — SALICYLATE LEVEL: Salicylate Lvl: <7 mg/dL — ABNORMAL LOW (ref 7.0–30.0)

## 2021-08-06 LAB — ETHANOL: Alcohol, Ethyl (B): <10 mg/dL (ref <10)

## 2021-08-06 NOTE — ED Notes (Signed)
Pt given warm blanket upon request.

## 2021-08-06 NOTE — ED Notes (Addendum)
Pt given sandwich tray and cup of ice water. ?
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
## 2021-08-06 NOTE — ED Notes (Addendum)
Patient dressed out at this time with Jenny Reichmann EDT and male officer. Belongings include: ?Yellow t-shirt ?Blue jeans ?Brown shoes  ?Multiple credit cards/ loose change ?Lighter ?Red underwear ? ?

## 2021-08-06 NOTE — ED Notes (Signed)
Pt given snack and water
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
## 2021-08-06 NOTE — ED Notes (Signed)
Pt to bathroom

## 2021-08-06 NOTE — ED Notes (Signed)
Pt back to room.

## 2021-08-06 NOTE — ED Notes (Signed)
Per pt's family, all of his sons are alive and well.  ?
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
## 2021-08-06 NOTE — ED Notes (Signed)
TTS/psych staff at bedside.  ?

## 2021-08-07 DIAGNOSIS — F333 Major depressive disorder, recurrent, severe with psychotic symptoms: Secondary | ICD-10-CM

## 2021-08-07 MED ORDER — IBUPROFEN 600 MG PO TABS
600.0000 mg | ORAL_TABLET | Freq: Three times a day (TID) | ORAL | Status: DC | PRN
  Administered 2021-08-07: 600 mg via ORAL
  Filled 2021-08-07: qty 1

## 2021-08-07 MED ORDER — ACETAMINOPHEN 325 MG PO TABS
650.0000 mg | ORAL_TABLET | Freq: Once | ORAL | Status: AC
Start: 2021-08-07 — End: 2021-08-07
  Administered 2021-08-07: 650 mg via ORAL
  Filled 2021-08-07: qty 2

## 2021-08-07 NOTE — ED Notes (Signed)
Confirmed with secretary that pt has IVC paperwork that was continued from PD bringing in pt by provider KRM since order not noted in chart.  ?

## 2021-08-07 NOTE — BH Assessment (Addendum)
Referral information for Psychiatric Hospitalization faxed to;  ?  ?[facility] ([phone]-or- [phone]),  ?  ?[facility] ([phone]), No adult beds available  ?  ?[facility] ([phone]),  ?  ?[facility] ([phone] -or- [phone]),  ?  ?[facility] ([phone]) ?

## 2021-08-07 NOTE — ED Notes (Signed)
Pt back to room at this time
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
## 2021-08-07 NOTE — ED Notes (Signed)
Hospital meal provided, pt tolerated w/o complaints.  Waste discarded appropriately.  

## 2021-08-07 NOTE — ED Notes (Signed)
IVC  CONSULT  DONE  PENDING  PLACEMENT 

## 2021-08-07 NOTE — ED Notes (Signed)
Pt. Transferred to BHU , room# 3 from ED .Patient was screened by security before entering the unit. Report to include Situation, Background, Assessment and Recommendations from Erskine Squibb, [place] . Pt. Oriented to unit including Q15 minute rounds as well as the security cameras for their protection. Patient is alert and oriented, warm and dry in no acute distress.   ?

## 2021-08-07 NOTE — ED Notes (Addendum)
During nursing assessment was A/Ox 3 .  .Mr David Lowe  stated that he is not currently have thoughts or feelings of SI/HI.  Mr David Lowe reported that he is no longer having auditory or visual hallucinations.  Pt affect is congruent with circumstances , eye contact is good , speech is of normal rate and volume  with appropriate verbiage noted.  Staff addressed any feelings or concerns that have been brought up.  Medications have not been ordered or administered.  Pt was able to verbalize to staff that he has not slept for more than an hour - two hours consecutively in >10 days.  Pt also stated that he understands that a lack of sleep will cause delusions coupled with drug abuse.  Pt stated that he "just needs a full belly and some good sleep".  Continue to monitor patient as ordered for any changes in behaviors and for continued safety.  ? ?

## 2021-08-07 NOTE — ED Notes (Signed)
Snack and beverage given. 

## 2021-08-07 NOTE — ED Notes (Signed)
Pt to bathroom at this time

## 2021-08-07 NOTE — ED Notes (Signed)
Patient provided snack at appropriate snack time.  Pt consumed 100% of snack provided, tolerated well w/o complaints   Trash disposted of appropriately by patient.  

## 2021-08-07 NOTE — ED Notes (Addendum)
Unlocked bathroom door to allow patient to shower.  Staff continuously monitored dayroom outside of bathroom door during pt shower.  Pt was given hygiene items as well as:  I clean top, 1 clean bottom, with 1 pair of disposable underwear.  Pt changed out into clean clothing.  Staff disposed of all shower supplies. Shower room cleaned and secured for next use.  

## 2021-08-07 NOTE — ED Notes (Signed)
Report to include Situation, Background, Assessment, and Recommendations received from Katie RN. Patient alert and oriented, warm and dry, in no acute distress. Patient denies SI, HI, AVH and pain. Patient made aware of Q15 minute rounds and security cameras for their safety. Patient instructed to come to me with needs or concerns.  

## 2021-08-08 DIAGNOSIS — F19959 Other psychoactive substance use, unspecified with psychoactive substance-induced psychotic disorder, unspecified: Secondary | ICD-10-CM

## 2021-08-08 NOTE — ED Notes (Signed)
Pt discharged home.  All belongings returned to patient. Pt denies SI/HI.   

## 2021-08-08 NOTE — ED Notes (Signed)
Pt given snack. 

## 2021-08-08 NOTE — Consult Note (Signed)
[facility] ? ?08/08/2021 6:05 PM ?David BrashJoey Hoefer  ?MRN:  [id number] ? ? ?Method of visit?: Face to Face  @1700  ? ?Subjective: Upon reassessment today, patient is no longer under the influence of substances.  Patient has been clear and coherent for more  than 24 hours.  He is alert, oriented x3, polite.  He speaks in linear sentences.  Patient denies suicidal thoughts or intent.  Denies homicidal ideations or ideas of harming anyone else.  Denies auditory or visual hallucinations or paranoia.  Patient does not appear to be responding to internal stimuli. Discussed at length patient's substance use and its impact on his behaviors.  Patient states that he is in chronic pain and the doctor will not give him pain medications so he uses crack cocaine.  Discussed what happens when he uses crack cocaine, such as the incident that brought him here.  Patient is able to consciously agree at this time that he should stop using crack for pain relief and states that he will return [place] his primary care doctor [place] to a pain clinic and or substance use rehabilitation programs. ? Patient is safe for discharge at this point and is requesting to be discharged.  We will release IVC, no longer meets criteria. ? ?Principal Problem: Psychoactive substance-induced psychosis (HCC) ?Diagnosis:  Principal Problem: ?  Psychoactive substance-induced psychosis (HCC) ?Active Problems: ?  Substance abuse (HCC) ?  Unspecified depressive disorder ?  Cocaine use disorder, severe, dependence (HCC) ?  Opioid use disorder, moderate, dependence (HCC) ?  Cannabis use disorder, severe, dependence (HCC) ?  Tobacco use disorder ?  Sedative, hypnotic or anxiolytic dependence (HCC) ?  Benzodiazepine withdrawal (HCC) ?  Alcohol dependence with alcohol-induced mood disorder (HCC) ?  Benzodiazepine abuse (HCC) ?  MDD (major depressive disorder), recurrent, severe, with psychosis (HCC) ? ?Total Time spent with patient: 30  minutes ? ?Past Psychiatric History: See prior ? ?Past Medical History:  ?Past Medical History:  ?Diagnosis Date  ? Anxiety attack   ? MDD (major depressive disorder)   ? History reviewed. No pertinent surgical history. ?Family History: No family history on file. ?Family Psychiatric  History:  ?Social History:  ?Social History  ? ?Substance and Sexual Activity  ?Alcohol Use Yes  ? Comment: last use today,  6 beers  ?   ?Social History  ? ?Substance and Sexual Activity  ?Drug Use Yes  ? Types: Marijuana, Cocaine, Benzodiazepines, Methamphetamines  ? Comment: today  ?  ?Social History  ? ?Socioeconomic History  ? Marital status: Single  ?  Spouse name: Not on file  ? Number of children: Not on file  ? Years of education: Not on file  ? Highest education level: Not on file  ?Occupational History  ? Not on file  ?Tobacco Use  ? Smoking status: Every Day  ?  Packs/day: 0.50  ?  Types: Cigarettes  ? Smokeless tobacco: Never  ? Tobacco comments:  ?  declined  ?Vaping Use  ? Vaping Use: Never used  ?Substance and Sexual Activity  ? Alcohol use: Yes  ?  Comment: last use today,  6 beers  ? Drug use: Yes  ?  Types: Marijuana, Cocaine, Benzodiazepines, Methamphetamines  ?  Comment: today  ? Sexual activity: Yes  ?Other Topics Concern  ? Not on file  ?Social History Narrative  ? Not on file  ? ?Social Determinants of Health  ? ?Financial Resource Strain: Not on file  ?Food Insecurity: Not  on file  ?Transportation Needs: Not on file  ?Physical Activity: Not on file  ?Stress: Not on file  ?Social Connections: Not on file  ? ? ?Sleep: Good ? ?Appetite:  Good ? ?Current Medications: ?Current Facility-Administered Medications  ?Medication Dose Route Frequency Provider Last Rate Last Admin  ? ibuprofen (ADVIL) tablet 600 mg  600 mg Oral Q8H PRN Shaune Pollack, MD   600 mg at 08/07/21 1645  ? ?Current Outpatient Medications  ?Medication Sig Dispense Refill  ? buPROPion (WELLBUTRIN SR) 150 MG 12 hr tablet Take 300 mg by mouth daily.  (Patient not taking: Reported on 07/02/2020)    ? doxepin (SINEQUAN) 100 MG capsule Take 100 mg by mouth at bedtime. (Patient not taking: Reported on 07/02/2020)    ? ? ?Lab Results:  ?Results for orders placed or performed during the hospital encounter of 08/06/21 (from the past 48 hour(s))  ?Comprehensive metabolic panel     Status: Abnormal  ? Collection Time: 08/06/21  6:49 PM  ?Result Value Ref Range  ? Sodium 138 135 - 145 mmol/L  ? Potassium 4.3 3.5 - 5.1 mmol/L  ? Chloride 103 98 - 111 mmol/L  ? CO2 27 22 - 32 mmol/L  ? Glucose, Bld 98 70 - 99 mg/dL  ?  Comment: Glucose reference range applies only to samples taken after fasting for at least 8 hours.  ? BUN 22 (H) 6 - 20 mg/dL  ? Creatinine, Ser 0.96 0.61 - 1.24 mg/dL  ? Calcium 9.5 8.9 - 10.3 mg/dL  ? Total Protein 7.8 6.5 - 8.1 g/dL  ? Albumin 4.6 3.5 - 5.0 g/dL  ? AST 23 15 - 41 U/L  ? ALT 24 0 - 44 U/L  ? Alkaline Phosphatase 75 38 - 126 U/L  ? Total Bilirubin 1.2 0.3 - 1.2 mg/dL  ? GFR, Estimated >60 >60 mL/min  ?  Comment: (NOTE) ?Calculated using the CKD-EPI Creatinine Equation (2021) ?  ? Anion gap 8 5 - 15  ?  Comment: Performed at [facility], [address] [place], [place] [address]  ?Ethanol     Status: None  ? Collection Time: 08/06/21  6:49 PM  ?Result Value Ref Range  ? Alcohol, Ethyl (B) <10 <10 mg/dL  ?  Comment: (NOTE) ?Lowest detectable limit for serum alcohol is 10 mg/dL. ? ?For medical purposes only. ?Performed at [facility], [address] [place], ?[place] [address] ?  ?Salicylate level     Status: Abnormal  ? Collection Time: 08/06/21  6:49 PM  ?Result Value Ref Range  ? Salicylate Lvl <7.0 (L) 7.0 - 30.0 mg/dL  ?  Comment: Performed at [facility], [address], [place], [place] [address]  ?Acetaminophen level     Status: Abnormal  ? Collection Time: 08/06/21  6:49 PM  ?Result Value Ref Range  ? Acetaminophen (Tylenol), Serum <10 (L) 10 - 30 ug/mL  ?  Comment: (NOTE) ?Therapeutic concentrations  vary significantly. A range of 10-30 ug/mL  ?may be an effective concentration for many patients. However, some  ?are best treated at concentrations outside of this range. ?Acetaminophen concentrations >150 ug/mL at 4 hours after ingestion  ?and >50 ug/mL at 12 hours after ingestion are often associated with  ?toxic reactions. ? ?Performed at [facility], [address] [place], ?[place] [address] ?  ?cbc     Status: None  ? Collection Time: 08/06/21  6:49 PM  ?Result Value Ref Range  ? WBC 8.9 4.0 -  10.5 K/uL  ? RBC 4.59 4.22 - 5.81 MIL/uL  ? Hemoglobin 14.3 13.0 - 17.0 g/dL  ? HCT 41.5 39.0 - 52.0 %  ? MCV 90.4 80.0 - 100.0 fL  ? MCH 31.2 26.0 - 34.0 pg  ? MCHC 34.5 30.0 - 36.0 g/dL  ? RDW 12.5 11.5 - 15.5 %  ? Platelets 251 150 - 400 K/uL  ? nRBC 0.0 0.0 - 0.2 %  ?  Comment: Performed at [facility], [address], [place], [place] [address]  ?Urine Drug Screen, Qualitative     Status: Abnormal  ? Collection Time: 08/06/21  6:49 PM  ?Result Value Ref Range  ? Tricyclic, Ur Screen NONE DETECTED NONE DETECTED  ? Amphetamines, Ur Screen POSITIVE (A) NONE DETECTED  ? MDMA (Ecstasy)Ur Screen NONE DETECTED NONE DETECTED  ? Cocaine Metabolite,Ur [place] NONE DETECTED NONE DETECTED  ? Opiate, Ur Screen NONE DETECTED NONE DETECTED  ? Phencyclidine (PCP) Ur S NONE DETECTED NONE DETECTED  ? Cannabinoid 50 Ng, Ur [place] POSITIVE (A) NONE DETECTED  ? Barbiturates, Ur Screen NONE DETECTED NONE DETECTED  ? Benzodiazepine, Ur Scrn NONE DETECTED NONE DETECTED  ? Methadone Scn, Ur NONE DETECTED NONE DETECTED  ?  Comment: (NOTE) ?Tricyclics + metabolites, urine    Cutoff 1000 ng/mL ?Amphetamines + metabolites, urine  Cutoff 1000 ng/mL ?MDMA (Ecstasy), urine              Cutoff 500 ng/mL ?Cocaine Metabolite, urine          Cutoff 300 ng/mL ?Opiate + metabolites, urine        Cutoff 300 ng/mL ?Phencyclidine (PCP), urine         Cutoff 25 ng/mL ?Cannabinoid, urine                 Cutoff 50 ng/mL ?Barbiturates +  metabolites, urine  Cutoff 200 ng/mL ?Benzodiazepine, urine              Cutoff 200 ng/mL ?Methadone, urine                   Cutoff 300 ng/mL ? ?The urine drug screen provides only a preliminary, unconfirmed ?analytical

## 2021-08-08 NOTE — ED Notes (Signed)
IVC pending placement 

## 2021-08-08 NOTE — ED Notes (Signed)
Pt refused VS at discharge.

## 2021-08-08 NOTE — ED Notes (Signed)
Pt received dinner tray.

## 2021-08-08 NOTE — ED Provider Notes (Signed)
Emergency Medicine Observation Re-evaluation Note ? ?David Lowe is a 31 y.o. male, seen on rounds today.  Pt initially presented to the ED for complaints of Psychiatric Evaluation ?Currently, the patient is resting, denies any acute concerns. ? ?Physical Exam  ?BP 115/86 (BP Location: Left Arm)   Pulse 85   Temp 98.2 ?F (36.8 ?C) (Oral)   Resp 16   Ht 5\' 9"  (1.753 m)   Wt 72.6 kg   SpO2 98%   BMI 23.63 kg/m?  ?Physical Exam ?General: no acute distress ? ?Psych: calm ? ?ED Course / MDM  ?EKG:  ? ?I have reviewed the labs performed to date as well as medications administered while in observation.  Recent changes in the last 24 hours include none. ? ?Plan  ?Current plan is for inpatient. ?David Lowe is under involuntary commitment. ?  ? ?  ?Linwood Dibbles, MD ?08/08/21 1520 ? ?
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
## 2021-08-08 NOTE — ED Notes (Signed)
Patient refused shower earlier and now patient changed his mind and shower items was given, ?

## 2021-08-08 NOTE — ED Provider Notes (Signed)
Pt cleared by psychiatric NP who has rescinded his IVC. He has a ride on the way. We will discharge with return precautions and [facility] referral.  ?  ?Vladimir Crofts, MD ?08/08/21 1803 ? ?
